# Patient Record
Sex: Male | Born: 1945 | Race: White | Hispanic: No | Marital: Married | Smoking: Never smoker
Health system: Southern US, Community
[De-identification: ages and names within clinical notes are randomized; demographics above are authoritative.]

## PROBLEM LIST (undated history)

## (undated) DIAGNOSIS — N183 Chronic kidney disease, stage 3 unspecified: Secondary | ICD-10-CM

## (undated) DIAGNOSIS — E039 Hypothyroidism, unspecified: Secondary | ICD-10-CM

## (undated) DIAGNOSIS — N200 Calculus of kidney: Secondary | ICD-10-CM

## (undated) DIAGNOSIS — E785 Hyperlipidemia, unspecified: Secondary | ICD-10-CM

## (undated) DIAGNOSIS — I10 Essential (primary) hypertension: Secondary | ICD-10-CM

## (undated) DIAGNOSIS — I509 Heart failure, unspecified: Secondary | ICD-10-CM

## (undated) DIAGNOSIS — I219 Acute myocardial infarction, unspecified: Secondary | ICD-10-CM

## (undated) DIAGNOSIS — I2510 Atherosclerotic heart disease of native coronary artery without angina pectoris: Secondary | ICD-10-CM

## (undated) HISTORY — PX: NEPHROLITHOTOMY: SUR881

## (undated) HISTORY — PX: RHINOPLASTY: SUR1284

## (undated) HISTORY — PX: SHOULDER SURGERY: SHX246

---

## 1997-11-10 ENCOUNTER — Inpatient Hospital Stay (HOSPITAL_COMMUNITY): Admission: AD | Admit: 1997-11-10 | Discharge: 1997-11-14 | Payer: Self-pay | Admitting: Cardiology

## 1998-02-18 ENCOUNTER — Inpatient Hospital Stay (HOSPITAL_COMMUNITY): Admission: EM | Admit: 1998-02-18 | Discharge: 1998-02-20 | Payer: Self-pay | Admitting: Emergency Medicine

## 1999-03-12 ENCOUNTER — Ambulatory Visit (HOSPITAL_COMMUNITY): Admission: RE | Admit: 1999-03-12 | Discharge: 1999-03-12 | Payer: Self-pay | Admitting: Cardiology

## 1999-04-05 ENCOUNTER — Encounter: Payer: Self-pay | Admitting: Cardiothoracic Surgery

## 1999-04-07 ENCOUNTER — Encounter: Payer: Self-pay | Admitting: Cardiothoracic Surgery

## 1999-04-07 ENCOUNTER — Inpatient Hospital Stay (HOSPITAL_COMMUNITY): Admission: RE | Admit: 1999-04-07 | Discharge: 1999-04-13 | Payer: Self-pay | Admitting: Cardiothoracic Surgery

## 1999-04-07 HISTORY — PX: CORONARY ARTERY BYPASS GRAFT: SHX141

## 1999-04-08 ENCOUNTER — Encounter: Payer: Self-pay | Admitting: Cardiothoracic Surgery

## 1999-04-09 ENCOUNTER — Encounter: Payer: Self-pay | Admitting: Cardiothoracic Surgery

## 1999-06-22 ENCOUNTER — Encounter (HOSPITAL_COMMUNITY): Admission: RE | Admit: 1999-06-22 | Discharge: 1999-09-20 | Payer: Self-pay | Admitting: Cardiology

## 1999-08-10 ENCOUNTER — Inpatient Hospital Stay (HOSPITAL_COMMUNITY): Admission: EM | Admit: 1999-08-10 | Discharge: 1999-08-11 | Payer: Self-pay | Admitting: Emergency Medicine

## 1999-08-10 ENCOUNTER — Encounter: Payer: Self-pay | Admitting: Emergency Medicine

## 2000-06-14 ENCOUNTER — Encounter: Payer: Self-pay | Admitting: Emergency Medicine

## 2000-06-14 ENCOUNTER — Inpatient Hospital Stay (HOSPITAL_COMMUNITY): Admission: EM | Admit: 2000-06-14 | Discharge: 2000-06-15 | Payer: Self-pay | Admitting: Emergency Medicine

## 2000-11-28 ENCOUNTER — Ambulatory Visit (HOSPITAL_BASED_OUTPATIENT_CLINIC_OR_DEPARTMENT_OTHER): Admission: RE | Admit: 2000-11-28 | Discharge: 2000-11-28 | Payer: Self-pay | Admitting: Pulmonary Disease

## 2001-03-31 ENCOUNTER — Ambulatory Visit (HOSPITAL_COMMUNITY): Admission: RE | Admit: 2001-03-31 | Discharge: 2001-03-31 | Payer: Self-pay | Admitting: Family Medicine

## 2001-03-31 ENCOUNTER — Encounter: Payer: Self-pay | Admitting: Family Medicine

## 2003-08-05 HISTORY — PX: ORIF FINGER FRACTURE: SHX2122

## 2003-08-31 ENCOUNTER — Inpatient Hospital Stay (HOSPITAL_COMMUNITY): Admission: EM | Admit: 2003-08-31 | Discharge: 2003-09-01 | Payer: Self-pay | Admitting: Emergency Medicine

## 2005-06-12 ENCOUNTER — Emergency Department (HOSPITAL_COMMUNITY): Admission: EM | Admit: 2005-06-12 | Discharge: 2005-06-13 | Payer: Self-pay | Admitting: Emergency Medicine

## 2005-06-30 ENCOUNTER — Ambulatory Visit: Payer: Self-pay | Admitting: Cardiology

## 2005-07-07 ENCOUNTER — Ambulatory Visit (HOSPITAL_COMMUNITY): Admission: RE | Admit: 2005-07-07 | Discharge: 2005-07-09 | Payer: Self-pay | Admitting: Urology

## 2005-07-07 ENCOUNTER — Encounter (INDEPENDENT_AMBULATORY_CARE_PROVIDER_SITE_OTHER): Payer: Self-pay | Admitting: Specialist

## 2007-09-27 ENCOUNTER — Ambulatory Visit (HOSPITAL_COMMUNITY): Admission: RE | Admit: 2007-09-27 | Discharge: 2007-09-27 | Payer: Self-pay | Admitting: Internal Medicine

## 2008-01-10 ENCOUNTER — Inpatient Hospital Stay (HOSPITAL_COMMUNITY): Admission: RE | Admit: 2008-01-10 | Discharge: 2008-01-12 | Payer: Self-pay | Admitting: Orthopaedic Surgery

## 2008-09-27 ENCOUNTER — Emergency Department (HOSPITAL_COMMUNITY): Admission: EM | Admit: 2008-09-27 | Discharge: 2008-09-27 | Payer: Self-pay | Admitting: Emergency Medicine

## 2009-11-30 ENCOUNTER — Observation Stay (HOSPITAL_COMMUNITY): Admission: RE | Admit: 2009-11-30 | Discharge: 2009-12-01 | Payer: Self-pay | Admitting: Urology

## 2010-08-22 LAB — CBC
HCT: 40 % (ref 39.0–52.0)
Hemoglobin: 13.2 g/dL (ref 13.0–17.0)
MCHC: 33.1 g/dL (ref 30.0–36.0)
MCV: 98 fL (ref 78.0–100.0)
RBC: 4.08 MIL/uL — ABNORMAL LOW (ref 4.22–5.81)
WBC: 7.7 10*3/uL (ref 4.0–10.5)

## 2010-08-22 LAB — BASIC METABOLIC PANEL
BUN: 16 mg/dL (ref 6–23)
CO2: 29 mEq/L (ref 19–32)
Chloride: 102 mEq/L (ref 96–112)
Creatinine, Ser: 1.5 mg/dL (ref 0.4–1.5)
Glucose, Bld: 90 mg/dL (ref 70–99)

## 2010-08-22 LAB — TYPE AND SCREEN
ABO/RH(D): A NEG
Antibody Screen: NEGATIVE

## 2010-08-22 LAB — URINE CULTURE
Colony Count: NO GROWTH
Culture: NO GROWTH

## 2010-08-22 LAB — HEMOGLOBIN AND HEMATOCRIT, BLOOD
HCT: 34.4 % — ABNORMAL LOW (ref 39.0–52.0)
Hemoglobin: 11.8 g/dL — ABNORMAL LOW (ref 13.0–17.0)

## 2010-08-22 LAB — SURGICAL PCR SCREEN: MRSA, PCR: NEGATIVE

## 2010-10-19 NOTE — Op Note (Signed)
NAME:  [name], [name]         ACCOUNT NO.:  [id number]   MEDICAL RECORD NO.:  [id number]          PATIENT TYPE:  INP   LOCATION:  5002                         FACILITY:  [facility]   PHYSICIAN:  Lubertha Basque. Dalldorf, M.D.DATE OF BIRTH:  04/12/1946   DATE OF PROCEDURE:  01/10/2008  DATE OF DISCHARGE:                               OPERATIVE REPORT   PREOPERATIVE DIAGNOSIS:  Left shoulder degenerative arthritis.   POSTOPERATIVE DIAGNOSIS:  Left shoulder degenerative arthritis.   PROCEDURE:  Left shoulder hemiarthroplasty.   ANESTHESIA:  General and block.   ATTENDING SURGEON:  Lubertha Basque. Jerl Santos, MD.   ASSISTANT:  [name], PA   INDICATIONS FOR PROCEDURE:  The patient is a 65 year old man with a very  long history of shoulder pain.  This has persisted despite oral anti-  inflammatories and injectables.  He has bone-on-bone degeneration and  pain which limits his ability to rest and use his arm.  He is offered a  hemiarthroplasty.  Informed operative consent was obtained after  discussion of possible complications including reaction to anesthesia,  infection, and neurovascular injury.   SUMMARY, FINDINGS, AND PROCEDURE:  Under general anesthesia and a block,  a left shoulder hemiarthroplasty was performed through a deltopectoral  approach.  He had advanced degenerative change, but excellent bone  quality.  We addressed his problem with an uncemented [employer]  system using a size 12 humeral component placed in 25 or 30 degrees of  retroversion, capped with a 56 x 18 humeral head.  Bryna Colander  assisted throughout and was invaluable to the completion of the case in  that he helped position and retract while I performed the procedure.  He  also closed simultaneously to help minimize the OR time.   DESCRIPTION OF PROCEDURE:  The patient was brought to the operating  suite where general anesthetic was applied without difficulty.  He was  also given a block in the  preanesthesia area.  He was positioned in a  slight beach chair position though fairly reclined.  He was prepped and  draped in normal sterile fashion.  After administration of IV Kefzol, a  standard deltopectoral approach was taken to the left shoulder.  An  incision was carried down to the deltopectoral interval which was  exploited to expose the shoulder.  I freed up the deltopectoral fascia.  We took the conjoined tendon in a medial direction.  The subscapularis  was released directly off bone with no tagged left in hopes of advancing  and improving his external rotation which was basically zero.  I tagged  this with #1 Ethibond suture.  The humeral head was then visualized.  The rotator cuff and biceps tendon were felt to be intact though the  biceps did have a partial tear.  I used an oscillating saw to make a cut  at an appropriate level with about 25 degrees of retroversion.  We then  reamed up to 12 followed by sequential broaching up to 12.  We did a  trial reduction and the 56 x 18 seemed to be most appropriate.  We then  placed the aforementioned components in 25  or 30 degrees of  retroversion.  We then capped with the 56 x 18 head assembly.  We did  make drill holes at the neck of the humerus in order to perform our  subscapularis repair.  The shoulder was reduced and was found to be  stable.  I then repaired the subscapularis to the leading edge of our  humeral head cut, thereby medializing this structure about 1 cm.  This  seemed to give [place] a nice tight repair which was stable.  I irrigated and  then we removed retractors and closed the deltopectoral interval loosely  with Vicryl.  Subcutaneous tissues reapproximated with 0 and 2-0 undyed  Vicryl and skin was closed with staples.  Adaptic was applied followed  by dry gauze and tape.  Estimated blood loss and intraoperative fluids  obtained from anesthesia records.   DISPOSITION:  The patient was extubated in the operating  room and taken  to recovery room in stable addition.  He was to be admitted to  orthopedic surgery service for appropriate postop care to include  perioperative antibiotics and immediate mobilization out of bed for DVT  prophylaxis.      Lubertha Basque Jerl Santos, M.D.  Electronically Signed     PGD/MEDQ  D:  01/10/2008  T:  01/11/2008  Job:  [id number]

## 2010-10-19 NOTE — Discharge Summary (Signed)
NAME:  [name], [name]         ACCOUNT NO.:  [id number]   MEDICAL RECORD NO.:  [id number]          PATIENT TYPE:  INP   LOCATION:  5002                         FACILITY:  [facility]   PHYSICIAN:  Lubertha Basque. Dalldorf, M.D.DATE OF BIRTH:  [birth year]   DATE OF ADMISSION:  01/10/2008  DATE OF DISCHARGE:  01/12/2008                               DISCHARGE SUMMARY   ADMITTING DIAGNOSES:  1. Left shoulder end-stage degenerative joint disease.  2. Hypertension.  3. Depression.   DISCHARGE DIAGNOSES:  1. Left shoulder end-stage degenerative joint disease.  2. Hypertension.  3. Depression.   OPERATION:  Left shoulder hemiarthroplasty.   BRIEF HISTORY:  [name] is a 65 year old white male patient, well-known  to our practice, who has had increasing left shoulder pain and inability  to move his arm comfortably with significant reduced range of motion.  Trouble sleeping at nighttime as well as increased.  He, on x-ray, has  end-stage bone-on-bone of his humeral head about maintenance of his  glenoid type or joint space.  He has had multiple intra-articular  injections, which had relieved his discomfort in short term, and we  discussed treatment options with him that being shoulder  hemiarthroplasty.   PERTINENT LABORATORY AND X-RAY FINDINGS:  Laboratory data; he had CBC,  iron, and chem-7, which are included in the electronic medical record,  not available at the time of this dictation.   X-ray revealed a normal chest without significant adverse features.   COURSE IN THE HOSPITAL:  [name] was admitted postoperatively,  placed on variety of oral analgesics for pain, IV Ancef q.8 h. x3 doses  and Dilaudid PCA pump was used.  He was in a sling.  He had incentive  spirometer and in and out catheter as needed.  He was kept on his home  medicines, which will be outlined at the end of this dictation, also on  a muscle relaxant.  The first day postoperative, his vital signs were  stable.  Blood pressure 101/70, heart rate 94, temperature 97, and O2  was 100.  He had good neurovascular status into his finger tips, and  left-sided dressing was benign.  No signs of infection or irritation of  the wound without sign of infection.  His dressing was changed on the  second day postop, and he was discharged home.   CONDITION ON DISCHARGE:  Improved.   FOLLOWUP:  He is on a low-sodium heart-healthy diet.  Continue on a  sling.  May change his dressing daily.  If any sign of infection, to  call the office at [phone], to be seen immediately or an appointment in  2 weeks just for normal followup.  He is kept on his home medicines,  which will be outlined.  Now, he was given a prescription for;  1. Percocet 1 or 2 q.4-6 h. p.r.n. pain.  2. Kept on Abilify 10 mg at bedtime.  3. Ambien 15 mg as needed for sleep.  4. Aspirin 81 mg once a day.  5. Lopressor 25 mg once a day.  6. Multivitamin once a day.  7. Lovaza 1 g capsule.  8. Fish  oil at bedtime.  9. Pepcid 40 mg as needed once a day.  10.Prinivil 5 mg once a day.  11.Synthroid 50 mcg once a day.  12.Zocor 20 mg once a day.  13.Robaxin 500 mg as needed for spasm q.6 h.      [name], P.A.      Lubertha Basque Jerl Santos, M.D.  Electronically Signed    MC/MEDQ  D:  01/12/2008  T:  01/13/2008  Job:  [id number]

## 2010-10-22 NOTE — Op Note (Signed)
[place]. [facility]  Patient:    [name]                 MRN: [id number] Proc. Date: 04/07/99 Adm. Date:  [id number] Attending:  Waldo Laine CC:         Gwenith Daily. Tyrone Sage, M.D.             Luis Abed, M.D. [facility]                           Operative Report  PREOPERATIVE DIAGNOSIS:  Coronary occlusive disease.  POSTOPERATIVE DIAGNOSIS:  Coronary occlusive disease.  OPERATION:  Coronary artery bypass grafting x 3 with left internal mammary artery to left anterior descending coronary artery, reverse saphenous vein graft to the diagonal coronary artery, reverse saphenous vein graft to the second obtuse marginal coronary artery.  SURGEON:  Gwenith Daily. Tyrone Sage, M.D.  ASSISTANTS: 1. Alleen Borne, M.D. 2. Luretha Rued. Ezzard Standing, P.A.  INDICATION:  The patient is a 65 year old male who previously had suffered an acute anterior myocardial infarction and underwent angioplasty and stenting of the LAD at the takeoff of a large diagonal branch.  The patient had initially done well but with recurrent symptoms had repeat cardiac catheterization.  This demonstrated  patent right coronary artery, a total occlusion of the LAD at the takeoff of the diagonal.  The diagonal itself had a 70% lesion.  In addition, the second obtuse marginal had a 70% lesion.  The patients LV function was depressed with anterior hypokinesis.  Because of the patients symptoms and significant LAD and diagonal disease, coronary artery bypass grafting was recommended.  The patient agreed and signed informed consent.  DESCRIPTION OF PROCEDURE:  With Swan-Ganz and arterial line monitoring in place, the patient underwent general endotracheal anesthesia without incident.  The skin, chest, and legs were prepped with Betadine and draped in the usual sterile manner.  A vein was harvested from the left lower leg and was of adequate quality and caliber.  Median  sternotomy was performed.  The left internal mammary artery was dissected down to the pedicle graft.  The distal artery was divided and had good free flow.  The pericardium was opened.  The patient did have anterior hypokinesis and obvious area of infarct on the apical region.  Consideration for off pump bypass was entertained; however, due to the patients  enlarged left ventricle, he was systemically heparinized.  It was decided to proceed with standard bypass.  He was systemically heparinized.  The ascending aorta and right atrium were cannulated.  In the aortic root, vent cardioplegia needle was introduced into the ascending aorta.  The patient was placed on cardiopulmonary bypass at 2.4 liters per minutes per sq/m.  Sites of anastomoses were selected and dissected out of the epicardium. The patients body temperature was cooled to 28 degrees.  Aortic cross clamp was applied and 500 cc of cold blood potassium cardioplegia was administered.  With  rapid diastolic arrest of the heart, myocardial septal temperature was monitored throughout the cross clamp.  Attention was turned first to the obtuse marginal coronary artery which was opened. The second obtuse marginal coronary artery which was opened and admitted a 1.5 m probe.  Using a running 7-0 Prolene, a distal anastomosis was performed with a segment of reverse saphenous vein graft.  Attention was then turned to the larger diagonal coronary artery which was opened.  This was high on the  lateral wall and not suitable for sequential bypass with mammary.  The vessel was opened and admitted a 1.5 mm probe.  Using a running 7-0 Prolene, distal anastomosis was performed.  Attention was turned to the left anterior descending coronary artery which was opened in the midportion of the vessel.  A 1.5 mm probe passed distally.  Using a running 8-0 Prolene, a left internal mammary artery was anastomosed into the left anterior  descending coronary artery with a running 8-0 Prolene with release of he Edwards bulldog on the mammary artery.  There was appropriate rise in myocardial septal temperature.  Aortic cross clamp was removed.  Total cross clamp was 48 minutes.  The patient required electrical defibrillation to return to a sinus rhythm. Partial occlusion clamp was placed on the ascending aorta.  Two punch aortotomies were performed.  Each of the two vein grafts were anastomosed to the ascending aorta.  Air was evacuated from the graft and partial occlusion clamp was removed. Sites of anastomosis were inspected and free of bleeding with the exception of he diagonal coronary artery where additional tacking sutures were placed.  The patient was then weaned from cardiopulmonary bypass without difficulty and remained hemodynamically.  He was decannulated in the usual fashion.  Protamine sulfate was administered with the operative field hemostatic.  Two atrial and two ventricular pacing wires were applied.  Graft markers were applied.  A left pleural tube and two mediastinal tubes were left in place.  The sternum was closed with a #6 stainless steel wire.  The fascia was closed with interrupted 0 Vicryl and a running 3-0 Vicryl in the  subcutaneous tissue, a 4-0 subcuticular stitch in the skin edges.  Dry dressings were applied.  Sponge and needle count was reported as correct at completion of the procedure.  The patient tolerated the procedure without obvious complication.  He was transferred to the surgical intensive care unit for further postoperative care.DD:  04/12/99 TD:  04/13/99 Job: [id number]

## 2010-10-22 NOTE — Consult Note (Signed)
NAME:  [name], [name]                   ACCOUNT NO.:  [id number]   MEDICAL RECORD NO.:  [id number]                   PATIENT TYPE:  INP   LOCATION:  5008                                 FACILITY:  [facility]   PHYSICIAN:  Artist Pais. Mina Marble, M.D.           DATE OF BIRTH:  04-27-1946   DATE OF CONSULTATION:  08/31/2003  DATE OF DISCHARGE:                                   CONSULTATION   PHYSICIAN REQUESTING CONSULTATION:  Dr. Ethelda Chick.   REASON FOR CONSULTATION:  Mr. [name] is a 65 year old right  hand dominant male who was at work earlier today and got his hand caught  between a cage and a forklift.  He presents today with an open wound on the  lower aspect of his right fifth digit with an angulated proximal phalangeal  fracture and open volar wound over the metacarpophalangeal joint.  He is 57.  He has an allergy to SULFA DRUGS.  He is currently on Accupril, an aspirin a  day and Prozac.  He has a history of coronary artery bypass grafting,  hypertension, elevated cholesterol levels.  Dr. Nicholos Johns is his medical doctor.  No recent hospitalizations or surgeries.   FAMILY HISTORY:  Noncontributory.   SOCIAL HISTORY:  Noncontributory.   PHYSICAL EXAMINATION:  GENERAL:  This is a well-nourished male, pleasant,  alert and oriented times three.  EXTREMITIES:  Examination of his hand on the right has an obvious transverse  volar wound over the metacarpophalangeal joint.  His profundus superficialis  function appears to be intact.  NEUROLOGIC:  He is intact to sharp, dull, but he has an angulated digit on  the hand.   X-rays show an angulated proximal phalangeal fracture of the fifth digit.   IMPRESSION:  This is a 65 year old male with an open proximal phalangeal  fracture, angulated, on his dominant right hand.   RECOMMENDATIONS:  At this point and time, we will go to the operating room  for irrigation and debridement, percutaneous pinning and wound closure.  He  will have admission for antibiotics to treat his open fracture.                                               Artist Pais Mina Marble, M.D.    MAW/MEDQ  D:  08/31/2003  T:  09/01/2003  Job:  [id number]

## 2010-10-22 NOTE — Discharge Summary (Signed)
[name]. [facility]  Patient:    [name], [name]                MRN: [id number] Adm. Date:  [id number] Disc. Date: 06/15/00 Attending:  Talitha Givens Dictator:   Tereso Newcomer, P.A. CC:         Elana Alm. Eliezer Lofts., M.D.   Discharge Summary  DATE OF BIRTH:  [birth year]  REASON FOR ADMISSION:  Chest pain.  DISCHARGE DIAGNOSES: 1. Coronary artery disease. 2. Status post coronary artery bypass grafting in November of 2000, LIMA    to LAD, SVG to D2, SVG to OM-2. 3. Catheterization in March of 2001, LAD totaled after diagonal 2, 70-80%    before LIMA, D1 50%, D2 50% ramus intermediate, mild, circumflex okay,    OM-1 moderate to severe, diffuse, OM-2 totaled, RCA dominant, 20-30%,    grafts x 3, patent, EF 35%. 4. Hyperlipidemia. 5. Status post rhinoplasty. 6. History of nephrolithiasis surgery.  ALLERGIES:  SULFA.  PROCEDURES PERFORMED THIS ADMISSION:  Adenosine Cardiolite on June 15, 2000, revealing EF 34%, anterior akinesis, anteroseptal and apical scar, no ischemia.  ADMISSION HISTORY:  This 65 year old male awoke on the morning of admission with right-sided chest pain, 4/10 at its worst, described as sore or tight. He had some associated shortness of breath, no nausea or vomiting or diaphoresis.  He took nitroglycerin x 3 at home that helped and then he came to the emergency room.  He had noted some fatigue in the past 2-3 weeks and dyspnea on exertion, but no palpitations or edema.  PHYSICAL EXAMINATION:  Initial physical exam revealed a well-nourished, well-developed male in no acute distress.  Vital Signs:  Blood pressure was 124/66, pulse 72, respirations 20, O2 100% on room air, temperature 97.3. Neck without JVD or bruits.  Chest clear to auscultation bilaterally. Cardiac:  Regular rate and rhythm.  No significant murmurs, rubs, or gallops. Abdomen soft, nontender.  Extremities without edema.  LABORATORY:  Chest  x-ray:  Cardiac enlargement, post CABG, no acute disease. EKG sinus rhythm, no acute changes.  Initial labs:  Hemoglobin 13, hematocrit 38.1, white count 6500, platelet count 217,000.  CK 193, CK-MB 2.8, troponin I 0.01.  INR 1.0, PTT 28, sodium 138, potassium 4.6, chloride 104, CO2 30, BUN 20, creatinine 1.3, glucose 101.  HOSPITAL COURSE:  Patient was admitted for chest pain and ruled out for myocardial infarction by enzymes.  He went for the adenosine Cardiolite as noted above on June 16, 1999.  He tolerated the procedure well and had no immediate complications.  Given the results of the Cardiolite and negative cardiac enzymes, it was felt he was stable enough for discharge to home on the afternoon of June 15, 2000.  He is to continue his current therapy.  DISCHARGE MEDICATIONS: 1. Prozac as taken previously. 2. Lipitor 20 mg q.h.s. 3. Coated aspirin 325 mg q.d. 4. Toprol XL 50 mg q.d. 5. Vitamin E 400 international units q.d. 6. Multivitamin q.d. 7. Nitroglycerin 0.4 mg _________ chest pain.  ACTIVITY:  As tolerated.  DIET:  Low fat, low sodium diet.  WOUND CARE:  Not applicable.  FOLLOW-UP:  Follow-up is with Dr. Myrtis Ser on Friday, July 21, 2000, at 10:30 a.m.  He should follow up with his primary care physician as needed. DD:  06/15/00 TD:  06/15/00 Job: [id number]

## 2010-10-22 NOTE — Cardiovascular Report (Signed)
[place]. [facility]  Patient:    [name], [name]                MRN: [id number] Proc. Date: 08/11/99 Adm. Date:  [id number] Disc. Date: [id number] Attending:  Ronaldo Miyamoto CC:         Veneda Melter, M.D. [facility]             Luis Abed, M.D. [facility]             Robert A. Eliezer Lofts., M.D.                        Cardiac Catheterization  PROCEDURES PERFORMED: 1. Left heart catheterization. 2. Left ventriculogram. 3. Angiography of left internal mammary bypass graft. 4. Angiography saphenous vein graft. 5. Perclose suture of right femoral artery.  DIAGNOSES: 1. Native two-vessel coronary artery disease. 2. Patent left internal mammary artery and saphenous vein bypass grafts. 3. Moderate left ventricular dysfunction.  INDICATIONS:  Mr. [name] is a 65 year old white male with a history of coronary artery disease who suffered and anterior wall myocardial infarction in the past.  He has undergone percutaneous interventions and subsequent undergone coronary artery bypass graft surgery in November of 2000 with placement of a LIMA to the LAD, saphenous vein graft to the second diagonal branch and saphenous vein graft to the second marginal branch in the left circumflex artery.  He presents now with recurrence of chest discomfort prompting his admission to the hospital. He ruled out for acute myocardial infarction and presents now for left heart catheterization.  TECHNIQUE:  After informed consent was obtained, the patient was brought to the  cardiac catheterization lab where both groins were sterilely prepped and draped. Lidocaine 1% was used to infiltrate the right groin, and a 6 French sheath placed into the right femoral artery using the modified Seldinger technique.  The 6 [place] and JR4 catheters were then used to engage the left and right coronary arteries, and selective angiography was performed in various projections  using manual injections of contrast.  A JR4 catheter was then used to engage the saphenous vein graft to the marginal branch.  A LCD catheter was used to engage the saphenous vein graft to the second diagonal branch.  Finally an internal mammary catheter was used to engage the LIMA graft to the LAD.  A 6 French pigtail was subsequently advanced into the left ventricle and a left ventriculogram performed using power injections of contrast in the RAO projection.  At the termination of the case a Perclose suture closure device was deployed to the right femoral artery after an angiogram was performed confirming position of the sheath above the femoral bifurcation.  The first device failed to deploy sutures appropriately and a second device was deployed with adequate hemostasis.  The patient tolerated the  procedure well and was transferred to the floor in stable condition.  FINDINGS:  Left main trunk:  The left main trunk has mild irregularities.  Left anterior descending:  This again is a large caliber vessel that provides two diagonal branches in its proximal segment.  The LAD has mild disease in the proximal segment of 30%.  It is occluded after the second diagonal branch at the area of previous stenting.  The distal LAD has a long area of narrowing of 70-80% after the stent preceding the insertion site with a LIMA graft.  The remainder f the LAD is a small caliber vessel that  is seen to fill the LIMA graft and has mild irregularities.  The first diagonal branch is a medium caliber vessel that has a mild narrowing f 40-50% in its proximal segment.  The second diagonal branch is a medium caliber  vessel that has mild diffuse disease in the proximal mid section of approximately 50%. The distal section of the vessel fills via saphenous vein graft.  Ramus intermedius:  This is a medium caliber vessel that has mild irregularities.  Left circumflex artery:  This is a small  caliber vessel that provides two marginal branches in the mid section.  The first marginal branch is a trivial vessel with moderate to severe diffuse disease.  The second marginal branch is occluded in ts proximal segment.  It is seen to fill via a saphenous vein graft and has mild disease in its proximal segment with the mid and distal segment filling adequately via the saphenous vein graft.  Right coronary artery:  Right coronary artery is dominant.  This is a large caliber vessel that provides a posterior descending artery as well as a posterior ventricular branch in its terminal left segment.  There is mild diffuse disease in the right coronary artery with narrowing of 20-30%.  LIMA to the LAD:  Patent.  Saphenous vein graft to the second diagonal branch:  Patent.  saphenous vein graft to the second marginal branch:  Patent.  LEFT VENTRICULOGRAM:  Mildly dilated end-systolic and end-diastolic dimensions.  Overall left ventricular function is moderately impaired.  Ejection fraction approximately 35%.  There is akinesis of the mid anterior wall with severe hypokinesis of the distal anterior and apical walls.  No mitral regurgitation is noted.  LV pressure is 112/10, aortic is 112/70, LVEDP equals 29.  ASSESSMENT AND PLAN:  Mr. Record is a 65 year old gentleman with stable coronary artery disease.  At this point there are no critical lesions in his major epicardial vessels that require attention and thus further medical management will be pursued. DD:  08/11/99 TD:  08/12/99 Job: [id number]

## 2010-10-22 NOTE — Op Note (Signed)
NAME:  [name], [name]                   ACCOUNT NO.:  [id number]   MEDICAL RECORD NO.:  [id number]                   PATIENT TYPE:  EMS   LOCATION:  MINO                                 FACILITY:  [facility]   PHYSICIAN:  Artist Pais. Mina Marble, M.D.           DATE OF BIRTH:  [birth year]   DATE OF PROCEDURE:  08/31/2003  DATE OF DISCHARGE:                                 OPERATIVE REPORT   PREOPERATIVE DIAGNOSES:  Grade II open fracture, proximal phalanx, right  hand, fifth digit.   POSTOPERATIVE DIAGNOSES:  Grade II open fracture, proximal phalanx, right  hand, fifth digit.   PROCEDURE:  Irrigation and debridement, open reduction and internal fixation  open fracture,proximal phalanx, right hand, fifth digit.   SURGEON:  Artist Pais. Mina Marble, M.D.   ASSISTANT:  None.   ANESTHESIA:  General.   TOURNIQUET TIME:  30 minutes.   COMPLICATIONS:  None.   DRAINS:  None.   DESCRIPTION OF PROCEDURE:  The patient was taken to the operating room.  After the induction of adequate general anesthesia, the right upper  extremity was prepped and draped in the usual sterile fashion.  An Esmarch  was used to exsanguinate the limb.  Tourniquet was inflated to 250 mmHg.  At  this point in time, an open wound over the volar aspect of the  metacarpophalangeal joint area of the right hand was irrigated and debrided  with a 2 liters of normal saline.  Clot and nonviable material was irrigated  out.  This wound was then loosely closed with 5-0 nylon.  The patient had an  open fracture of the proximal phalanx.  The patient was placed in a Jahss  maneuver to reduce the fracture, and two 045 K wires were driven from  proximal to distal in cross fashion across the fracture site from dorsal to  volar to reduce the fracture and stabilize it.  The K wires were cut outside  the skin and bent upon themselves.  The patient was then placed in a sterile  dressing with Xeroform, 4 x 4 fluffs, and a volar splint  with the wrist  extended, MPs and PIPs flexed, position to safety.  The patient tolerated  the procedure well and went to recovery in stable fashion.                                               Artist Pais Mina Marble, M.D.    MAW/MEDQ  D:  08/31/2003  T:  08/31/2003  Job:  [id number]

## 2010-10-22 NOTE — Op Note (Signed)
NAME:  [name], [name]         ACCOUNT NO.:  [id number]   MEDICAL RECORD NO.:  [id number]          PATIENT TYPE:  OIB   LOCATION:  1415                         FACILITY:  [facility]   PHYSICIAN:  Excell Seltzer. Annabell Howells, M.D.    DATE OF BIRTH:  [birth year]   DATE OF PROCEDURE:  07/07/2005  DATE OF DISCHARGE:                                 OPERATIVE REPORT   PROCEDURE:  Left percutaneous nephrolithotomy for greater than 2.5 cm stone.   PREOPERATIVE DIAGNOSIS:  Left renal stone.   POSTOPERATIVE DIAGNOSIS:  Left renal stone.   SURGEON:  Bjorn Pippin, M.D.   RADIOLOGIST:  D. Oley Balm, M.D.   ANESTHESIA:  General.   SPECIMEN:  Stone fragments.   BLOOD LOSS:  Approximately 200 mL.   DRAINS:  24-French Ainsworth nephrostomy tube and Foley catheter.   COMPLICATIONS:  None.   INDICATIONS:  [name] is a 65 year old white male with a 28-mm left  renal pelvic stone who is to undergo a percutaneous nephrolithotomy.   FINDINGS AND PROCEDURE:  The patient was given Cipro this morning, he was  taken to the radiology suite where upper pole access was obtained onto his  renal pelvic stone.   He was then taken to the operating room where general anesthetic was induced  on a holding room stretcher. He was then rolled prone on chest rolls, all  pressure points were padded appropriately. A Foley catheter had been placed  using sterile technique prior to positioning and PAS hose were placed.   Once in position, the patient was prepped with Betadine solution and draped  in the usual sterile fashion.   Dr. Deanne Coffer then dilated the nephrostomy tract with the TractMaster balloon  after I incised the skin at the nephrostomy site with a knife. Prior to  dilating the tract, a safety wire had been placed. The nephrostomy sheath  was passed over the balloon catheter and the balloon catheter was removed  leaving the wire through the sheath.   The rigid nephroscope was then passed, a few clots  were removed and then the  stone was visualized.   The stone was engaged with the combination lithoclast ultrasonic lithotrite  and been broken into manageable fragments which were then removed with a  three-prong grasper.   After the entire stone had been fragmented and removed, visual inspection  revealed no residual fragments and fluoroscopic inspection revealed no  obvious residual fragments.   At this point, a 24-French Ainsworth catheter was passed over the guidewire  through the sheath, the balloon was filled with 3 mL of fluid. The wire was  removed, contrast was instilled indicating good position of the nephrostomy  tube. The working sheath was then removed from off the catheter and a safety  catheter was placed over the safety wire to the distal ureter.   The nephrostomy tube and safety catheter then secured to the skin with 2-0  silk suture. The drapes removed, a dressing of 4x4s, ABD and Hypafix was  applied, the nephrostomy tube was placed to straight drainage and secured to  the patient's leg.   The patient was then rolled back  supine, his anesthetic was reversed and he  was moved to the recovery room in stable condition and there were no  complications.      Excell Seltzer. Annabell Howells, M.D.  Electronically Signed     JJW/MEDQ  D:  07/07/2005  T:  07/07/2005  Job:  [id number]   cc:   Molly Maduro A. Nicholos Johns, M.D.  Fax: [phone]   Willa Rough, M.D.  [address]  [address]  [place]  [place] [address]

## 2010-10-22 NOTE — Discharge Summary (Signed)
[place]  Patient:    [name], [name]                MRN: [id number] Adm. Date:  [id number] Disc. Date: [id number] Attending:  Ronaldo Miyamoto Dictator:   Vania Rea. Rinehuls, PA CC:         Luis Abed, M.D. [facility]             Robert A. Eliezer Lofts., M.D.                           Discharge Summary  HISTORY ON ADMISSION:  This is a 65 year old male who presented to the emergency room for evaluation of chest pain.  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  His ejection fraction was 40% by Cardiolite performed in July 2000.  He has a history of an I from 1999 with PTCA stenting of the LAD.  The patient stated that his pain started the day before at work while climbing stairs.  His pain lasted approximately three hours.  He did not take any nitroglycerin.  The pain eased off on its own.  He went to work again on the day of admission and developed pain again while climbing stairs.  He states this is the first pain he has had recently.  The patient states the pain is in the left side of his chest without radiation.  Described as a constant burning associated with mild shortness of breath and diaphoresis.  There has been no nausea.  This pain is similar to what he experienced prior to his coronary artery bypass graft surgery.  He rated it as  seven on a 1-10 scale.  He stated that the episode on the day of admission lasted approximately 90 minutes and eased off on its own.  He went to the medical department at work and was sent to [facility] for evaluation.  He was pain free when seen in emergency room.  The patient apparently had a stress test one to two weeks ago in the office.  Apparently that was performed August 02, 1999.  It revealed an ejection  fraction of 34% and no signs of ischemia.  PAST MEDICAL HISTORY:  The patient has a history of elevated cholesterol levels. He has a history of renal calculi with previous surgery for this problem. History of a rhinoplasty, coronary artery bypass graft surgery as noted above.  He has o history of diabetes mellitus and no history of hypertension.  ALLERGIES:  SULFA.  MEDICATIONS:  Toprol, Lipitor, aspirin, Prozac.  Patient did not know the dosages. I was later to find out he was also on Accupril prior to admission as well.  SOCIAL HISTORY:  Patient is married.  He has two children.  He lives in [place].  He does not use alcohol or tobacco.  He works at [employer].  FAMILY HISTORY:  Positive for coronary artery disease.  HOSPITAL COURSE:  As noted, this patient was admitted to [facility] for further evaluation of chest pain.  Cardiac enzymes were found to be negative. e was scheduled for cardiac catheterization which was performed on August 11, 1999 y Dr. Chales Abrahams.  The patients ejection fraction was noted to be 35%.  He had native wo vessel coronary disease  with a patent LIMA and saphenous vein graft with moderate LV dysfunction.  The left main revealed mild disease.  The LAD had two  diagonal branches.  There was approximal 30% lesion, distal 100% lesion after the second diagonal which filled via the LIMA.  The first and second diagonals had proximal 50% lesions.  The ramus intermedius had mild disease, approximately 30%. The left circumflex had two OMs.  There was trivial OM disease.  OM2 was occluded and filled via the saphenous vein graft.  The RCA was dominant.  There was a PDA as well as a PV branch with mild disease of 20-30%.  Again, all the grafts were widely patent.  Arrangements were made to discharge the patient later that evening.  Continued medical management was recommended.  There were no critical lesions.  LABORATORIES:  A chest x-ray showed  stable cardiac enlargement, but no active disease.  Comprehensive metabolic panel was within normal limits.  Cardiac enzymes were negative.  A CBC revealed a mild anemia with hemoglobin 12.7, hematocrit 37.2, BC 6.9, platelets 201,000.  DISCHARGE MEDICATIONS:  The patient was told to stay on the same medications he was on prior to admission.  He was told to avoid any strenuous activity and no driving for two days.  He was told not to lift more than 10 pounds for one week, therefore he was not to return to work for at least one week.  He was to be on a low salt, low fat diet.  He was told to call the office if he had any increased pain, swelling, or bleeding from his groin.  He was told to call the office the following day for a follow-up appointment with Dr. Myrtis Ser.  He was to see Dr. Nicholos Johns as needed or as scheduled.  PROBLEM LIST: 1. Admission August 10, 1999 for evaluation of chest pain.  Cardiac enzymes negative. 2. Cardiac catheterization August 11, 1999 revealing EF 35% with patent grafts and no    critical lesions. 3. LV dysfunction EF 35%. 4. History of coronary artery bypass graft surgery November 2000.  Please see    grafts as described above. 5. History of MI in 1999 with PTCA stenting of the LAD. 6. Positive family history of coronary artery disease. 7. Cardiolite performed August 02, 1999.  Negative for ischemia, EF 34%. 8. Perclose system used following catheterization, therefore patient not to return    to work for one week. DD:  08/11/99 TD:  08/11/99 Job: [id number]

## 2011-03-04 LAB — BASIC METABOLIC PANEL
Calcium: 9.6
Creatinine, Ser: 1.52 — ABNORMAL HIGH
GFR calc Af Amer: 57 — ABNORMAL LOW
GFR calc non Af Amer: 47 — ABNORMAL LOW
Sodium: 135

## 2011-03-04 LAB — CBC
Hemoglobin: 14
Platelets: 231
RBC: 4.31
RDW: 14
WBC: 7.1

## 2011-07-10 ENCOUNTER — Other Ambulatory Visit: Payer: Self-pay

## 2011-07-10 ENCOUNTER — Encounter (HOSPITAL_COMMUNITY): Payer: Self-pay

## 2011-07-10 ENCOUNTER — Emergency Department (HOSPITAL_COMMUNITY): Payer: BC Managed Care – PPO

## 2011-07-10 ENCOUNTER — Inpatient Hospital Stay (HOSPITAL_COMMUNITY)
Admission: EM | Admit: 2011-07-10 | Discharge: 2011-07-12 | DRG: 543 | Disposition: A | Discharge status: Home / Self Care | Payer: BC Managed Care – PPO | Attending: Internal Medicine | Admitting: Internal Medicine

## 2011-07-10 DIAGNOSIS — R55 Syncope and collapse: Principal | ICD-10-CM | POA: Diagnosis present

## 2011-07-10 DIAGNOSIS — Z87442 Personal history of urinary calculi: Secondary | ICD-10-CM

## 2011-07-10 DIAGNOSIS — I429 Cardiomyopathy, unspecified: Secondary | ICD-10-CM | POA: Diagnosis present

## 2011-07-10 DIAGNOSIS — I2510 Atherosclerotic heart disease of native coronary artery without angina pectoris: Secondary | ICD-10-CM | POA: Diagnosis present

## 2011-07-10 DIAGNOSIS — N183 Chronic kidney disease, stage 3 unspecified: Secondary | ICD-10-CM | POA: Diagnosis present

## 2011-07-10 DIAGNOSIS — I509 Heart failure, unspecified: Secondary | ICD-10-CM

## 2011-07-10 DIAGNOSIS — I5022 Chronic systolic (congestive) heart failure: Secondary | ICD-10-CM | POA: Diagnosis present

## 2011-07-10 DIAGNOSIS — I129 Hypertensive chronic kidney disease with stage 1 through stage 4 chronic kidney disease, or unspecified chronic kidney disease: Secondary | ICD-10-CM | POA: Diagnosis present

## 2011-07-10 DIAGNOSIS — Z882 Allergy status to sulfonamides status: Secondary | ICD-10-CM

## 2011-07-10 DIAGNOSIS — N200 Calculus of kidney: Secondary | ICD-10-CM | POA: Diagnosis present

## 2011-07-10 DIAGNOSIS — Z79899 Other long term (current) drug therapy: Secondary | ICD-10-CM

## 2011-07-10 DIAGNOSIS — E039 Hypothyroidism, unspecified: Secondary | ICD-10-CM | POA: Diagnosis present

## 2011-07-10 DIAGNOSIS — I252 Old myocardial infarction: Secondary | ICD-10-CM

## 2011-07-10 DIAGNOSIS — Z951 Presence of aortocoronary bypass graft: Secondary | ICD-10-CM

## 2011-07-10 DIAGNOSIS — I219 Acute myocardial infarction, unspecified: Secondary | ICD-10-CM

## 2011-07-10 DIAGNOSIS — I2589 Other forms of chronic ischemic heart disease: Secondary | ICD-10-CM | POA: Diagnosis present

## 2011-07-10 HISTORY — DX: Acute myocardial infarction, unspecified: I21.9

## 2011-07-10 HISTORY — DX: Chronic kidney disease, stage 3 unspecified: N18.30

## 2011-07-10 HISTORY — DX: Essential (primary) hypertension: I10

## 2011-07-10 HISTORY — DX: Calculus of kidney: N20.0

## 2011-07-10 HISTORY — DX: Heart failure, unspecified: I50.9

## 2011-07-10 HISTORY — DX: Atherosclerotic heart disease of native coronary artery without angina pectoris: I25.10

## 2011-07-10 HISTORY — DX: Chronic kidney disease, stage 3 (moderate): N18.3

## 2011-07-10 HISTORY — DX: Hypothyroidism, unspecified: E03.9

## 2011-07-10 HISTORY — DX: Hyperlipidemia, unspecified: E78.5

## 2011-07-10 LAB — BASIC METABOLIC PANEL
Chloride: 101 mEq/L (ref 96–112)
Creatinine, Ser: 1.55 mg/dL — ABNORMAL HIGH (ref 0.50–1.35)
GFR calc Af Amer: 53 mL/min — ABNORMAL LOW (ref >90)
GFR calc non Af Amer: 46 mL/min — ABNORMAL LOW (ref >90)
Potassium: 4.5 mEq/L (ref 3.5–5.1)

## 2011-07-10 LAB — DIFFERENTIAL
Basophils Absolute: 0 10*3/uL (ref 0.0–0.1)
Basophils Relative: 0 % (ref 0–1)
Eosinophils Absolute: 0.1 10*3/uL (ref 0.0–0.7)
Monocytes Absolute: 0.8 10*3/uL (ref 0.1–1.0)
Neutro Abs: 6.5 10*3/uL (ref 1.7–7.7)
Neutrophils Relative %: 76 % (ref 43–77)

## 2011-07-10 LAB — CBC
MCH: 33.6 pg (ref 26.0–34.0)
MCHC: 34.1 g/dL (ref 30.0–36.0)
RDW: 13.3 % (ref 11.5–15.5)

## 2011-07-10 LAB — POCT I-STAT TROPONIN I: Troponin i, poc: 0.01 ng/mL (ref 0.00–0.08)

## 2011-07-10 MED ORDER — CARVEDILOL 3.125 MG PO TABS
3.1250 mg | ORAL_TABLET | Freq: Every day | ORAL | Status: DC
  Administered 2011-07-10 – 2011-07-12 (×3): 3.125 mg via ORAL
  Filled 2011-07-10 (×3): qty 1

## 2011-07-10 MED ORDER — OMEGA-3-ACID ETHYL ESTERS 1 G PO CAPS
1.0000 g | ORAL_CAPSULE | Freq: Two times a day (BID) | ORAL | Status: DC
  Administered 2011-07-10 – 2011-07-12 (×4): 1 g via ORAL
  Filled 2011-07-10 (×5): qty 1

## 2011-07-10 MED ORDER — DOXYCYCLINE HYCLATE 100 MG PO TABS
100.0000 mg | ORAL_TABLET | Freq: Two times a day (BID) | ORAL | Status: DC
  Administered 2011-07-10 – 2011-07-12 (×4): 100 mg via ORAL
  Filled 2011-07-10 (×5): qty 1

## 2011-07-10 MED ORDER — ALBUTEROL SULFATE (5 MG/ML) 0.5% IN NEBU: 2.5000 mg | INHALATION_SOLUTION | RESPIRATORY_TRACT | Status: DC | PRN

## 2011-07-10 MED ORDER — NITROGLYCERIN 0.4 MG SL SUBL: 0.4000 mg | SUBLINGUAL_TABLET | SUBLINGUAL | Status: DC | PRN

## 2011-07-10 MED ORDER — FLUOXETINE HCL 20 MG PO CAPS
20.0000 mg | ORAL_CAPSULE | Freq: Every day | ORAL | Status: DC
  Administered 2011-07-10 – 2011-07-12 (×3): 20 mg via ORAL
  Filled 2011-07-10 (×3): qty 1

## 2011-07-10 MED ORDER — ONDANSETRON HCL 4 MG/2ML IJ SOLN: 4.0000 mg | Freq: Four times a day (QID) | INTRAMUSCULAR | Status: DC | PRN

## 2011-07-10 MED ORDER — HYDROCODONE-ACETAMINOPHEN 5-325 MG PO TABS
1.0000 | ORAL_TABLET | ORAL | Status: DC | PRN
  Administered 2011-07-11: 1 via ORAL
  Administered 2011-07-11: 2 via ORAL
  Administered 2011-07-11: 1 via ORAL
  Administered 2011-07-12 (×2): 2 via ORAL
  Filled 2011-07-10: qty 2
  Filled 2011-07-10: qty 1
  Filled 2011-07-10 (×3): qty 2

## 2011-07-10 MED ORDER — GUAIFENESIN-DM 100-10 MG/5ML PO SYRP: 5.0000 mL | ORAL_SOLUTION | ORAL | Status: DC | PRN

## 2011-07-10 MED ORDER — ASPIRIN EC 81 MG PO TBEC
81.0000 mg | DELAYED_RELEASE_TABLET | Freq: Every day | ORAL | Status: DC
  Administered 2011-07-10 – 2011-07-12 (×3): 81 mg via ORAL
  Filled 2011-07-10 (×3): qty 1

## 2011-07-10 MED ORDER — SODIUM CHLORIDE 0.9 % IJ SOLN
3.0000 mL | Freq: Two times a day (BID) | INTRAMUSCULAR | Status: DC
  Administered 2011-07-10 – 2011-07-12 (×4): 3 mL via INTRAVENOUS

## 2011-07-10 MED ORDER — FAMOTIDINE 10 MG PO TABS
10.0000 mg | ORAL_TABLET | Freq: Every day | ORAL | Status: DC
  Administered 2011-07-10 – 2011-07-12 (×3): 10 mg via ORAL
  Filled 2011-07-10 (×3): qty 1

## 2011-07-10 MED ORDER — LEVOTHYROXINE SODIUM 50 MCG PO TABS
50.0000 ug | ORAL_TABLET | Freq: Every day | ORAL | Status: DC
  Administered 2011-07-10 – 2011-07-12 (×2): 50 ug via ORAL
  Filled 2011-07-10 (×3): qty 1

## 2011-07-10 MED ORDER — TAMSULOSIN HCL 0.4 MG PO CAPS
0.4000 mg | ORAL_CAPSULE | Freq: Every day | ORAL | Status: DC
  Administered 2011-07-10 – 2011-07-12 (×3): 0.4 mg via ORAL
  Filled 2011-07-10 (×3): qty 1

## 2011-07-10 MED ORDER — ONDANSETRON HCL 4 MG PO TABS: 4.0000 mg | ORAL_TABLET | Freq: Four times a day (QID) | ORAL | Status: DC | PRN

## 2011-07-10 MED ORDER — THERA M PLUS PO TABS
1.0000 | ORAL_TABLET | Freq: Every day | ORAL | Status: DC
  Administered 2011-07-10 – 2011-07-12 (×3): 1 via ORAL
  Filled 2011-07-10 (×3): qty 1

## 2011-07-10 MED ORDER — HEPARIN SODIUM (PORCINE) 5000 UNIT/ML IJ SOLN
5000.0000 [IU] | Freq: Three times a day (TID) | INTRAMUSCULAR | Status: DC
  Administered 2011-07-10 – 2011-07-12 (×5): 5000 [IU] via SUBCUTANEOUS
  Filled 2011-07-10 (×8): qty 1

## 2011-07-10 MED ORDER — ROSUVASTATIN CALCIUM 20 MG PO TABS
20.0000 mg | ORAL_TABLET | Freq: Every day | ORAL | Status: DC
  Administered 2011-07-10 – 2011-07-12 (×3): 20 mg via ORAL
  Filled 2011-07-10 (×3): qty 1

## 2011-07-10 MED ORDER — FUROSEMIDE 20 MG PO TABS
20.0000 mg | ORAL_TABLET | Freq: Once | ORAL | Status: AC
  Administered 2011-07-10: 20 mg via ORAL
  Filled 2011-07-10: qty 1

## 2011-07-10 MED ORDER — ZOLPIDEM TARTRATE 5 MG PO TABS
5.0000 mg | ORAL_TABLET | Freq: Every day | ORAL | Status: DC
  Administered 2011-07-10 – 2011-07-12 (×2): 5 mg via ORAL
  Filled 2011-07-10 (×3): qty 1

## 2011-07-10 MED ORDER — DOXYCYCLINE HYCLATE 100 MG PO CAPS: 100.0000 mg | ORAL_CAPSULE | Freq: Two times a day (BID) | ORAL | Status: DC

## 2011-07-10 NOTE — ED Provider Notes (Signed)
History     CSN: [id number]  Arrival date & time 07/10/11  1157   First MD Initiated Contact with Patient 07/10/11 1306      Chief Complaint  Patient presents with  . Near Syncope    (Consider location/radiation/quality/duration/timing/severity/associated sxs/prior treatment) HPI Comments: Patient reports that he had a syncopal episode earlier today.  He reports that he bent over and then became lightheaded and passed out.  LOC for 4-5 minutes according to wife.  He did hit his head when he fell.  He reports that he had a similar episode of syncope 5 days ago.  He reports that both times he was feeling lightheaded prior to the episode.  PMH significant for CABG in 2000 and recently diagnosed CHF currently on Lasix.  His PCP is Dr. Carlyle Basques with the [facility] in [place].  Patient is a 66 y.o. male presenting with syncope. The history is provided by the patient and the spouse.  Loss of Consciousness The problem has been resolved. Pertinent negatives include no abdominal pain, chest pain, chills, coughing, diaphoresis, fever, headaches, nausea, neck pain, numbness, rash, visual change or vomiting. The symptoms are aggravated by bending.    Past Medical History  Diagnosis Date  . Myocardial infarction   . Kidney stones   . Coronary artery disease     Past Surgical History  Procedure Date  . Shoulder surgery   . Coronary artery bypass graft     History reviewed. No pertinent family history.  History  Substance Use Topics  . Smoking status: Never Smoker   . Smokeless tobacco: Not on file  . Alcohol Use: No      Review of Systems  Constitutional: Negative for fever, chills and diaphoresis.  HENT: Negative for neck pain.   Respiratory: Negative for cough and shortness of breath.   Cardiovascular: Positive for syncope. Negative for chest pain.  Gastrointestinal: Negative for nausea, vomiting and abdominal pain.  Skin: Negative for rash.  Neurological: Positive for syncope and  light-headedness. Negative for seizures, numbness and headaches.  Psychiatric/Behavioral: Negative for confusion.    Allergies  Sulfa antibiotics  Home Medications  No current outpatient prescriptions on file.  BP 145/90  Pulse 93  Temp(Src) 98.6 F (37 C) (Oral)  Resp 14  SpO2 97%  Physical Exam  Nursing note and vitals reviewed. Constitutional: He is oriented to person, place, and time. He appears well-developed and well-nourished. No distress.  HENT:  Head:    Eyes: EOM are normal. Pupils are equal, round, and reactive to light.  Neck: Normal range of motion. Neck supple.  Cardiovascular: Normal rate, regular rhythm and normal heart sounds.   Pulmonary/Chest: Effort normal and breath sounds normal. He has no wheezes.  Abdominal: Soft. Bowel sounds are normal.  Neurological: He is alert and oriented to person, place, and time. He has normal strength. No cranial nerve deficit or sensory deficit. Coordination and gait normal.  Skin: Skin is warm and dry. No rash noted. He is not diaphoretic.  Psychiatric: He has a normal mood and affect.    ED Course  Procedures (including critical care time)  Labs Reviewed  CBC - Abnormal; Notable for the following:    RBC 4.17 (*)    All other components within normal limits  BASIC METABOLIC PANEL - Abnormal; Notable for the following:    Glucose, Bld 104 (*)    Creatinine, Ser 1.55 (*)    GFR calc non Af Amer 46 (*)    GFR calc Af  Amer 74 (*)    All other components within normal limits  DIFFERENTIAL  POCT I-STAT TROPONIN I  PRO B NATRIURETIC PEPTIDE   Dg Chest 2 View  07/10/2011  *RADIOLOGY REPORT*  Clinical Data: Shortness of breath  CHEST - 2 VIEW  Comparison: 09/27/2008  Findings: Left humeral arthroplasty partly visualized.  Evidence of CABG noted.  Moderate enlargement of the cardiomediastinal silhouette noted with vascular congestion but no overt edema.  No pleural effusion.  No acute osseous finding.  IMPRESSION:  Cardiomegaly and vascular congestion but no focal acute abnormality.  Original Report Authenticated By: Harrel Lemon, M.D.     No diagnosis found.   Date: 07/10/2011  Rate: 81  Rhythm: normal sinus rhythm  QRS Axis: left  Intervals: normal  ST/T Wave abnormalities: nonspecific T wave changes  Conduction Disutrbances:none  Narrative Interpretation:  T wave inversion AVL  Old EKG Reviewed: unchanged  4:25 PM Discussed patient with Dr. Thedore Mins.  He reports that he will admit patient to a telemetry bed.  MDM  Patient with two episodes of syncope in the past week.  Negative CT scan.  Negative cardiac enzymes.  Normal EKG.  Orthostatic vitals not demonstrating orthostatic hypotension.  Will admit patient for further work up of syncope.        Pascal Lux McDougal, PA-C 07/11/11 1845

## 2011-07-10 NOTE — ED Notes (Signed)
Family at bedside. Reports decrease energy for 2 months.

## 2011-07-10 NOTE — ED Notes (Signed)
Attempted to call report to floor.  Denequal, RN unable to take report at this time.  Informed this RN she will call back ASAP when floor pt care is completed.

## 2011-07-10 NOTE — ED Notes (Signed)
Pt reports did not feel well in December. Was told he was in heart failure and given lasix. Pt had syncopal episode on Tuesday and today.

## 2011-07-10 NOTE — ED Provider Notes (Signed)
Pt reports recent fatigue with syncope Denies CP/SOB currently He reports recent diagnosis of CHF Will need further evaluation for syncope in setting of CHF BP 152/87  Pulse 91  Temp(Src) 98 F (36.7 C) (Oral)  Resp 18  SpO2 94%   Joya Gaskins, MD 07/10/11 1316

## 2011-07-10 NOTE — H&P (Signed)
Jamahl Bevis ZOX:[id number]  Outpatient Primary MD for the patient is No primary provider on file.  With History of -  Past Medical History  Diagnosis Date  . Myocardial infarction   . Kidney stones   . Coronary artery disease   . CHF (congestive heart failure)       Past Surgical History  Procedure Date  . Shoulder surgery   . Coronary artery bypass graft     in for   Chief Complaint  Patient presents with  . Near Syncope     HPI  [name]  is a 66 y.o. male, with history of hypothyroidism, recent diagnosis of CHF, CAD, CK D. stage III, who was started on Lasix one month ago on a when necessary basis, comes in with an episode of syncope and collapse. This episode happened early this morning at home, patient was doing some household chores he felt lightheaded and subsequently passed out and lost consciousness for 5 minutes. His wife was around and she confirms this episode. There was no seizure-like activity there was no bowel or bladder incontinence, no tongue bites or head injury. Patient also states that he had a similar episode one week ago also. Denies any aura or flashes, denies any palpitations denies any chest pain cough shortness of breath. Denies any diarrhea. No new medications besides 20 mg of Lasix on an as needed basis for edema. Currently is edema free. Patient is currently symptom-free. Unremarkable head CT chest x-ray and EKG.    Review of Systems    In addition to the HPI above,   No Fever-chills, No Headache, No changes with Vision or hearing, No problems swallowing food or Liquids, No Chest pain, Cough or Shortness of Breath, No Abdominal pain, No Nausea or Vommitting, Bowel movements are regular, No Blood in stool or Urine, No dysuria, No new skin rashes or bruises, No new joints pains-aches,  No new weakness, tingling, numbness in any extremity, No recent weight gain or loss, No polyuria, polydypsia or polyphagia, No  significant Mental Stressors.  A full 10 point Review of Systems was done, except as stated above, all other Review of Systems were negative.   Social History History  Substance Use Topics  . Smoking status: Never Smoker   . Smokeless tobacco: Not on file  . Alcohol Use: No      Family History CAD-father  Medication List  As of 07/10/2011  4:46 PM   ASK your doctor about these medications         acetaminophen 500 MG tablet   Commonly known as: TYLENOL   Take 500 mg by mouth every 6 (six) hours as needed. For pain      carvedilol 6.25 MG tablet   Commonly known as: COREG   Take 3.125 mg by mouth daily.      doxycycline 100 MG capsule   Commonly known as: VIBRAMYCIN   Take 100 mg by mouth 2 (two) times daily.      FLUoxetine 20 MG capsule   Commonly known as: PROZAC   Take 20 mg by mouth daily.      furosemide 20 MG tablet   Commonly known as: LASIX   Take 20 mg by mouth daily.      ibuprofen 400 MG tablet   Commonly known as: ADVIL,MOTRIN   Take 400 mg by mouth every 6 (six) hours as needed. For headaches      levothyroxine 50 MCG tablet   Commonly known as: SYNTHROID, LEVOTHROID  Take 50 mcg by mouth daily.      metoprolol succinate 50 MG 24 hr tablet   Commonly known as: TOPROL-XL   Take 25 mg by mouth daily. Take with or immediately following a meal.      multivitamins ther. w/minerals Tabs   Take 1 tablet by mouth daily.      nitroGLYCERIN 0.4 MG SL tablet   Commonly known as: NITROSTAT   Place 0.4 mg under the tongue every 5 (five) minutes as needed. For chest pain      omega-3 acid ethyl esters 1 G capsule   Commonly known as: LOVAZA   Take 1 g by mouth 2 (two) times daily.      ranitidine 300 MG tablet   Commonly known as: ZANTAC   Take 300 mg by mouth at bedtime.      simvastatin 80 MG tablet   Commonly known as: ZOCOR   Take 40 mg by mouth at bedtime.      Tamsulosin HCl 0.4 MG Caps   Commonly known as: FLOMAX   Take 0.4 mg by mouth.       zolpidem 10 MG tablet   Commonly known as: AMBIEN   Take 10 mg by mouth at bedtime as needed.           Allergies  Allergen Reactions  . Sulfa Antibiotics     Physical Exam  Vitals  Blood pressure 112/62, pulse 88, temperature 98.1 F (36.7 C), temperature source Oral, resp. rate 13, SpO2 96.00%.   1. General middle aged white lying in bed in NAD,   2. Normal affect and insight, Not Suicidal or Homicidal, Awake Alert, Oriented *3.  3. No F.N deficits, ALL C.Nerves Intact, Strength 5/5 all 4 extremities, Sensation intact all 4 extremities, Plantars down going.  4. Ears and Eyes appear Normal, Conjunctivae clear, PERRLA. Moist Oral Mucosa.  5. Supple Neck, No JVD, No cervical lymphadenopathy appriciated, No Carotid Bruits.  6. Symmetrical Chest wall movement, Good air movement bilaterally, CTAB.  7. RRR, No Gallops, Rubs or Murmurs, No Parasternal Heave.  8. Positive Bowel Sounds, Abdomen Soft, Non tender, No organomegaly appriciated,       No rebound -guarding or rigidity.  9.  No Cyanosis, Normal Skin Turgor, No Skin Rash or Bruise.  10. Good muscle tone,  joints appear normal , no effusions, Normal ROM.  11. No Palpable Lymph Nodes in Neck or Axillae     Data Review  CBC  Lab 07/10/11 1321  WBC 8.5  HGB 14.0  HCT 41.1  PLT 207  MCV 98.6  MCH 33.6  MCHC 34.1  RDW 13.3  LYMPHSABS 1.1  MONOABS 0.8  EOSABS 0.1  BASOSABS 0.0  BANDABS --   ------------------------------------------------------------------------------------------------------------------ Chemistries   Lab 07/10/11 1321  NA 137  K 4.5  CL 101  CO2 26  GLUCOSE 104*  BUN 19  CREATININE 1.55*  CALCIUM 9.4  MG --  AST --  ALT --  ALKPHOS --  BILITOT --   ------------------------------------------------------------------------------------------------------------------ CrCl is unknown because there is no height on file for the current  visit. ------------------------------------------------------------------------------------------------------------------ No results found for this basename: TSH,T4TOTAL,FREET3,T3FREE,THYROIDAB in the last 72 hours  Coagulation profile No results found for this basename: INR:5,PROTIME:5 in the last 168 hours ------------------------------------------------------------------------------------------------------------------- No results found for this basename: DDIMER:2 in the last 72 hours ------------------------------------------------------------------------------------------------------------------- Cardiac Enzymes No results found for this basename: CK:3,CKMB:3,TROPONINI:3,MYOGLOBIN:3 in the last 168 hours ------------------------------------------------------------------------------------------------------------------ No components found with this basename: POCBNP:3 ------------------------------------------------------------------------------------------------------------------  Imaging results:   Dg Chest 2 View  07/10/2011  *RADIOLOGY REPORT*  Clinical Data: Shortness of breath  CHEST - 2 VIEW  Comparison: 09/27/2008  Findings: Left humeral arthroplasty partly visualized.  Evidence of CABG noted.  Moderate enlargement of the cardiomediastinal silhouette noted with vascular congestion but no overt edema.  No pleural effusion.  No acute osseous finding.  IMPRESSION: Cardiomegaly and vascular congestion but no focal acute abnormality.  Original Report Authenticated By: Harrel Lemon, M.D.   Ct Head Wo Contrast  07/10/2011  *RADIOLOGY REPORT*  Clinical Data: Syncope.  Fatigue.  CT HEAD WITHOUT CONTRAST  Technique:  Contiguous axial images were obtained from the base of the skull through the vertex without contrast.  Comparison: None.  Findings: No mass lesion, mass effect, midline shift, hydrocephalus, hemorrhage.  No territorial ischemia or acute infarction.  Intracranial atherosclerosis is  present.  Mastoid air cells are clear.  Paranasal sinuses demonstrate right sphenoid sinus mucosal thickening.  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  IMPRESSION: Negative CT head.  Original Report Authenticated By: Andreas Newport, M.D.    My personal review of EKG: Rhythm NSR, Rate  90/min, QTc 457 , no Acute ST changes     Assessment & Plan  1. Syncope and collapse - in a patient with CAD, recent diagnosis of CHF who was recently placed on when necessary Lasix - we'll admit the patient on telemetry bed, will check orthostatics first orthostatic in the ER was negative, will check an echogram, EEG of the brain, rule out MI with serial troponins, check carotid duplex. I have also discussed the case with cardiologist Dr. Jacinto Halim she will see the patient in the morning.   2. Recent diagnosis of CHF-patient currently appears compensated has no edema minimal bibasal raise, he's not short of breath, we'll give him one dose of 20 mg Lasix and monitor.   3. CK D. stage III his creatinine is at baseline as compared to one year ago creatinine in the system.   4. Hypothyroidism no acute issues we'll continue the patient on home dose Synthroid and check a TSH.   5. History of coronary artery disease no acute issues home medications to be continued patient is pain-free, EKG unremarkable, will rule out MI with serial troponins.    6.Hypertension - continue Coreg with parameters, Hold Toprol ??.   DVT Prophylaxis Heparin   AM Labs Ordered, also please review Full Orders  Admission, patients condition and plan of care including tests being ordered have been discussed with the patient and wife who indicate understanding and agree with the plan and Code Status.  Code Status Full  Condition Marinell Blight K M.D on 07/10/2011 at 4:30 PM  [facility] Office  [phone]

## 2011-07-10 NOTE — Progress Notes (Signed)
Report received from ED, patient has arrived on unit and VS are stable.  Patient is refusing labs__________D. Manson Passey RN

## 2011-07-10 NOTE — ED Notes (Signed)
Pt with syncopal episode Tuesday and today, was bending over and standing back up, LOC for 3-4 mins, was not observed but heard

## 2011-07-11 ENCOUNTER — Encounter (HOSPITAL_COMMUNITY): Payer: Self-pay | Admitting: Physician Assistant

## 2011-07-11 ENCOUNTER — Inpatient Hospital Stay (HOSPITAL_COMMUNITY): Payer: BC Managed Care – PPO

## 2011-07-11 DIAGNOSIS — R55 Syncope and collapse: Secondary | ICD-10-CM

## 2011-07-11 DIAGNOSIS — I517 Cardiomegaly: Secondary | ICD-10-CM

## 2011-07-11 LAB — CARDIAC PANEL(CRET KIN+CKTOT+MB+TROPI)
CK, MB: 2 ng/mL (ref 0.3–4.0)
CK, MB: 2.2 ng/mL (ref 0.3–4.0)
Relative Index: INVALID (ref 0.0–2.5)
Total CK: 70 U/L (ref 7–232)
Troponin I: <0.3 ng/mL (ref <0.30)

## 2011-07-11 LAB — BASIC METABOLIC PANEL
CO2: 25 mEq/L (ref 19–32)
Chloride: 98 mEq/L (ref 96–112)
GFR calc Af Amer: 52 mL/min — ABNORMAL LOW (ref >90)
Potassium: 3.9 mEq/L (ref 3.5–5.1)

## 2011-07-11 LAB — CBC
HCT: 39.6 % (ref 39.0–52.0)
Hemoglobin: 13.9 g/dL (ref 13.0–17.0)
MCV: 97.1 fL (ref 78.0–100.0)
WBC: 9.1 10*3/uL (ref 4.0–10.5)

## 2011-07-11 NOTE — Progress Notes (Signed)
*  PRELIMINARY RESULTS* Vascular Ultrasound Carotid Duplex (Doppler) has been completed.  Preliminary findings: Bilaterally no significant ICA stenosis with antegrade vertebral flow.  Farrel Demark RDMS 07/11/2011, 4:43 PM

## 2011-07-11 NOTE — Progress Notes (Signed)
Pt back from EEG and doppler w/o any acute distress.  VSS.  Amanda Pea, RN

## 2011-07-11 NOTE — Progress Notes (Addendum)
Unable to obtain pt 's last set pf VS at 1600 VS due after having 2D echo with contrast.   Having EEG  Done of the floor  at this time.   Amanda Pea, RN

## 2011-07-11 NOTE — Progress Notes (Signed)
Subjective: Had some lightheaded earlier. No chest pain no dyspnea.  Objective: Filed Vitals:   07/11/11 1128 07/11/11 1129 07/11/11 1130 07/11/11 1131  BP: 145/79 117/73 145/89 146/80  Pulse: 85 91 91 91  Temp:      TempSrc:      Resp: 20 20 20 20   Height:      Weight:      SpO2:       Weight change:   Intake/Output Summary (Last 24 hours) at 07/11/11 1258 Last data filed at 07/11/11 1217  Gross per 24 hour  Intake   1040 ml  Output   1550 ml  Net   -510 ml    General: Alert, awake, oriented x3, in no acute distress.  HEENT: No bruits, no goiter.  Heart: Regular rate and rhythm, without murmurs, rubs, gallops.  Lungs: CTA, bilateral air movement.  Abdomen: Soft, nontender, nondistended, positive bowel sounds.  Neuro: Grossly intact, nonfocal. Extremities no edema.   Lab Results:  [facility] 07/11/11 0237 07/10/11 1321  NA 134* 137  K 3.9 4.5  CL 98 101  CO2 25 26  GLUCOSE 110* 104*  BUN 18 19  CREATININE 1.57* 1.55*  CALCIUM 9.7 9.4  MG -- --  PHOS -- --    Basename 07/11/11 0237 07/10/11 1321  WBC 9.1 8.5  NEUTROABS -- 6.5  HGB 13.9 14.0  HCT 39.6 41.1  MCV 97.1 98.6  PLT 208 207    Basename 07/11/11 1021 07/11/11 0245  CKTOTAL 70 80  CKMB 2.2 2.0  CKMBINDEX -- --  TROPONINI <0.30 <0.30    Basename 07/10/11 1656  TSH 0.413  T4TOTAL --  T3FREE --  THYROIDAB --   No results found for this basename: VITAMINB12:2,FOLATE:2,FERRITIN:2,TIBC:2,IRON:2,RETICCTPCT:2 in the last 72 hours  Micro Results: No results found for this or any previous visit (from the past 240 hour(s)).  Studies/Results: Dg Chest 2 View  07/10/2011  *RADIOLOGY REPORT*  Clinical Data: Shortness of breath  CHEST - 2 VIEW  Comparison: 09/27/2008  Findings: Left humeral arthroplasty partly visualized.  Evidence of CABG noted.  Moderate enlargement of the cardiomediastinal silhouette noted with vascular congestion but no overt edema.  No pleural effusion.  No acute osseous finding.   IMPRESSION: Cardiomegaly and vascular congestion but no focal acute abnormality.  Original Report Authenticated By: Harrel Lemon, M.D.   Ct Head Wo Contrast  07/10/2011  *RADIOLOGY REPORT*  Clinical Data: Syncope.  Fatigue.  CT HEAD WITHOUT CONTRAST  Technique:  Contiguous axial images were obtained from the base of the skull through the vertex without contrast.  Comparison: None.  Findings: No mass lesion, mass effect, midline shift, hydrocephalus, hemorrhage.  No territorial ischemia or acute infarction.  Intracranial atherosclerosis is present.  Mastoid air cells are clear.  Paranasal sinuses demonstrate right sphenoid sinus mucosal thickening.  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  IMPRESSION: Negative CT head.  Original Report Authenticated By: Andreas Newport, M.D.    Medications: I have reviewed the patient's current medications.   Patient Active Hospital Problem List:  Syncope and collapse (07/10/2011) No orthostatic. ECHO, Doppler pending. CT head negative, troponin time 2  Dr Jacinto Halim to see patient today.   CHF (congestive heart failure) () No evidence of acute Heart failure exacerbation.   Coronary artery disease () Aspirin, COREG.  Patient was also on metoprolol ??  CKD (chronic kidney disease), stage III (07/10/2011) Cr stable.  Hypothyroid (07/10/2011) Continue with synthroid.      LOS: 1 day  REGALADO,BELKYS M.D.  [facility] 07/11/2011, 12:58 PM

## 2011-07-11 NOTE — Progress Notes (Signed)
Othorstatic not done at this time, pt having a test.  Amanda Pea, RN

## 2011-07-11 NOTE — Progress Notes (Signed)
Utilization review complete 

## 2011-07-11 NOTE — Progress Notes (Signed)
   CARE MANAGEMENT NOTE 07/11/2011  Patient:  [name], [name]   Account Number:  [id number]  Date Initiated:  07/11/2011  Documentation initiated by:  Donn Pierini  Subjective/Objective Assessment:   Pt admitted with syncope     Action/Plan:   PTA pt lived at home with spouse, was independent with ADLs   Anticipated DC Date:  07/13/2011   Anticipated DC Plan:  HOME/SELF CARE      DC Planning Services  CM consult      Choice offered to / List presented to:             Status of service:  In process, will continue to follow Medicare Important Message given?   (If response is "NO", the following Medicare IM given date fields will be blank) Date Medicare IM given:   Date Additional Medicare IM given:    Discharge Disposition:    Per UR Regulation:    Comments:  PCP- Dr. Mack Guise  07/11/11- 1500- Donn Pierini RN, BSN [phone] Spoke with pt at bedside per conversation pt states that his PCP is Dr. Mack Guise and that he was independent at home with his wife PTA. CM to follow for any potential d/c needs

## 2011-07-11 NOTE — ED Provider Notes (Signed)
Medical screening examination/treatment/procedure(s) were conducted as a shared visit with non-physician practitioner(s) and myself.  I personally evaluated the patient during the encounter  2 episodes of postural syncope, from standing position, occurred while bending over. Today with LOC x 4 minutes per wife.  No tongue biting or incontinence.  No CP or SOB.  Hx CHF with suspected ICM followed at [place].  Neg orthostatics here.  No murmur suggestive of AS.  Glynn Octave, MD 07/11/11 Windell Moment

## 2011-07-11 NOTE — Consult Note (Signed)
Cardiology Consult Note   Patient ID: [name] MRN: [id number], DOB/AGE: 65/01/1946   Admit date: 07/10/2011 Date of Consult: 07/11/2011  Primary Physician: No primary provider on file. Primary Cardiologist: Jerral Bonito, MD  Pt. Profile: [name] is a 66 yo Caucasian male with PMHx significant for CAD (s/p CABG x 3 in 04/1999 and cath in 08/1999 no critical lesions, medically managed), ICM (unspecified CHF diagnosed [facility]), hx of prior mechanical fall (09/2008 with rib fractures), hypothyroidism, HL, HTN and obesity who was admitted to [facility] hospital after having a syncope episode.   Past Cardiac History  As listed in the patient profile and problem list.  Adenosine cardiolite stress 06/2000: fixed anteroseptal defect and at apex consistent with old infarct, no evidence of ischemia. EF 34% with diffuse hypokinesia and akinesia in the anteroseptal region.   Reason for consult: syncope  Problem List: Past Medical History  Diagnosis Date  . Myocardial infarction   . Nephrolithiasis   . CHF (congestive heart failure)   . CAD (coronary artery disease), native coronary artery     s/p CABG x 3 in 04/1999 and catheterization 08/1999, LAD totaled after diagonal 2, 70-80% before LIMA, D1 50%, D2 50% ramus intermediate, mild, circumflex okay, OM-1 moderate to severe, diffuse, OM-2 totaled, RCA dominant, 20-30%, grafts x 3 patent, EF 35%  . Hyperlipidemia   . Hypothyroidism   . Hypertension   . CKD (chronic kidney disease) stage 3, GFR 30-59 ml/min     Past Surgical History  Procedure Date  . Shoulder surgery   . Coronary artery bypass graft 04/1999    LIMA-LAD, SVG-D2, SVG-OM2  . Rhinoplasty   . Orif finger fracture 08/2003    Irrigation and debridement, open reduction and internal fixation of open fracture to proximal phalynx , right hand, 5th digit  . Nephrolithotomy 06/2005 and 11/2009    Left    Allergies:  Allergies  Allergen Reactions  .  Sulfa Antibiotics    HPI:   Yesterday morning, he reportedly bent over, felt lightheaded and woke up on the floor. This has happened twice within the last week, both times in the morning prior to eating breakfast. These episodes occur soon after standing. This has not occurred while standing. He denies instigating events besides bending over. His wife witnessed the most recent episode yesterday and Dunn he was unconscious for about 4-5 minutes. No reported seizure activity- involuntary movements, biting of tongue or incontinence. He reports fatigue and presyncope the last 3-4 months leading up to this event with associated decreased activity level. He is usually able to do household chores and retrieve the mail; however, has been unable to for the past 3-4 months. He denies chest pain, palpitations, shortness of breath during the episodes or leading up to last week.  No family history of syncope. No orthopnea, LE edema, PND. He Dunn he does not drink much water. And does not always eat 3 meals a day, occasionally skipping lunch.   Of note, he recently went to the [facility] for weakness 12/12. He Dunn that he had a CXR, which he said was normal, and was given Lasix PO. He does not remember having any other studies performed.   Upon presenting to the ED, CXR revealed cardiomegaly and vascular congestion without overt edema or pleural effusion. CT head negative. EKG without evidence of ischemia. CE neg x 2, TnI neg x 3. TSH WNL. BNP elevated to 3324. Orthostatic vital signs WNL yesterday, positive today.  Inpatient Medications:     . aspirin EC  81 mg Oral Daily  . carvedilol  3.125 mg Oral Daily  . doxycycline  100 mg Oral Q12H  . famotidine  10 mg Oral Daily  . FLUoxetine  20 mg Oral Daily  . furosemide  20 mg Oral Once  . heparin  5,000 Units Subcutaneous Q8H  . levothyroxine  50 mcg Oral QAC breakfast  . multivitamins ther. w/minerals  1 tablet Oral Daily  . omega-3 acid ethyl esters  1  g Oral BID  . rosuvastatin  20 mg Oral Daily  . sodium chloride  3 mL Intravenous Q12H  . Tamsulosin HCl  0.4 mg Oral Daily  . zolpidem  5 mg Oral QHS  . DISCONTD: doxycycline  100 mg Oral BID   Prescriptions prior to admission  Medication Sig Dispense Refill  . acetaminophen (TYLENOL) 500 MG tablet Take 500 mg by mouth every 6 (six) hours as needed. For pain      . carvedilol (COREG) 6.25 MG tablet Take 3.125 mg by mouth daily.      Marland Kitchen doxycycline (VIBRAMYCIN) 100 MG capsule Take 100 mg by mouth 2 (two) times daily.      Marland Kitchen FLUoxetine (PROZAC) 20 MG capsule Take 20 mg by mouth daily.      . furosemide (LASIX) 20 MG tablet Take 20 mg by mouth daily.      Marland Kitchen ibuprofen (ADVIL,MOTRIN) 400 MG tablet Take 400 mg by mouth every 6 (six) hours as needed. For headaches      . levothyroxine (SYNTHROID, LEVOTHROID) 50 MCG tablet Take 50 mcg by mouth daily.      . metoprolol succinate (TOPROL-XL) 50 MG 24 hr tablet Take 25 mg by mouth daily. Take with or immediately following a meal.      . Multiple Vitamins-Minerals (MULTIVITAMINS THER. W/MINERALS) TABS Take 1 tablet by mouth daily.      . nitroGLYCERIN (NITROSTAT) 0.4 MG SL tablet Place 0.4 mg under the tongue every 5 (five) minutes as needed. For chest pain      . omega-3 acid ethyl esters (LOVAZA) 1 G capsule Take 1 g by mouth 2 (two) times daily.      . ranitidine (ZANTAC) 300 MG tablet Take 300 mg by mouth at bedtime.      . simvastatin (ZOCOR) 80 MG tablet Take 40 mg by mouth at bedtime.      . Tamsulosin HCl (FLOMAX) 0.4 MG CAPS Take 0.4 mg by mouth.      . zolpidem (AMBIEN) 10 MG tablet Take 10 mg by mouth at bedtime as needed.       Family History  Problem Relation Age of Onset  . Coronary artery disease Father     History   Social History  . Marital Status: Married    Spouse Name: N/A    Number of Children: 2  . Years of Education: N/A   Occupational History  . Not on file.   Social History Main Topics  . Smoking status: Never  Smoker   . Smokeless tobacco: Not on file  . Alcohol Use: No  . Drug Use: No  . Sexually Active:    Other Topics Concern  . Not on file   Social History Narrative   Lives in [place], [place] with wife.     Review of Systems: General: negative for chills, fever, night sweats or weight changes.  Cardiovascular: negative for chest pain, dyspnea on exertion, edema, orthopnea, palpitations, paroxysmal nocturnal  dyspnea or shortness of breath Dermatological: negative for rash Respiratory: negative for cough or wheezing Urologic: negative for hematuria Abdominal: negative for nausea, vomiting, diarrhea, bright red blood per rectum, melena, or hematemesis Neurologic: positive for syncope, lightheadedness negative for visual changes All other systems reviewed and are otherwise negative except as noted above.  Physical Exam: Blood pressure 146/80, pulse 91, temperature 98 F (36.7 C), temperature source Oral, resp. rate 20, height 5\' 10"  (1.778 m), weight 116.4 kg (256 lb 9.9 oz), SpO2 96.00%.    General: Obese, NAD Head: Normocephalic, atraumatic, sclera non-icteric, no xanthomas Neck: Supple. Negative for carotid bruits. JVD mildly elevated, however difficult to appreciate due to body habitus Lungs: CTAB without wheezes, rales, or rhonchi. Breathing is unlabored. Heart: RRR with S1 S2. No murmurs, rubs, or gallops appreciated. Abdomen: Soft, non-tender, non-distended with normoactive bowel sounds. No hepatomegaly. No rebound/guarding. No obvious abdominal masses. Msk:  Strength and tone appears normal for age. Extremities: No clubbing, cyanosis or edema.  Distal pedal pulses are 2+ and equal bilaterally. Neuro: Alert and oriented X 3. Moves all extremities spontaneously. Psych:  Responds to questions appropriately with a normal affect.  Labs: Recent Labs  Basename 07/11/11 0237 07/10/11 1321   WBC 9.1 8.5   HGB 13.9 14.0   HCT 39.6 41.1   MCV 97.1 98.6   PLT 208 207    Lab  07/11/11 0237 07/10/11 1321  NA 134* 137  K 3.9 4.5  CL 98 101  CO2 25 26  BUN 18 19  CREATININE 1.57* 1.55*  CALCIUM 9.7 9.4  PROT -- --  BILITOT -- --  ALKPHOS -- --  ALT -- --  AST -- --  AMYLASE -- --  LIPASE -- --  GLUCOSE 110* 104*   Recent Labs  Basename 07/11/11 1021 07/11/11 0245   CKTOTAL 70 80   CKMB 2.2 2.0   CKMBINDEX -- --   TROPONINI <0.30 <0.30   POC TnI: 0.01  Basename 07/10/11 1656  TSH 0.413  T4TOTAL --  T3FREE --  THYROIDAB --   Radiology/Studies: Dg Chest 2 View  07/10/2011  *RADIOLOGY REPORT*  Clinical Data: Shortness of breath  CHEST - 2 VIEW  Comparison: 09/27/2008  Findings: Left humeral arthroplasty partly visualized.  Evidence of CABG noted.  Moderate enlargement of the cardiomediastinal silhouette noted with vascular congestion but no overt edema.  No pleural effusion.  No acute osseous finding.  IMPRESSION: Cardiomegaly and vascular congestion but no focal acute abnormality.  Original Report Authenticated By: Harrel Lemon, M.D.   Ct Head Wo Contrast  07/10/2011  *RADIOLOGY REPORT*  Clinical Data: Syncope.  Fatigue.  CT HEAD WITHOUT CONTRAST  Technique:  Contiguous axial images were obtained from the base of the skull through the vertex without contrast.  Comparison: None.  Findings: No mass lesion, mass effect, midline shift, hydrocephalus, hemorrhage.  No territorial ischemia or acute infarction.  Intracranial atherosclerosis is present.  Mastoid air cells are clear.  Paranasal sinuses demonstrate right sphenoid sinus mucosal thickening.  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  IMPRESSION: Negative CT head.  Original Report Authenticated By: Andreas Newport, M.D.   EKG: 07/10/2011: NSR 90 bpm, LVH, LAE, no ST-T wave changes  ASSESSMENT AND PLAN:   1. Syncope- he has a 3-4 month history of fatigue and presyncope leading up to these events which are positional in nature occuring soon after standing and upon bending over. This is  suspicious for orthostatic changes. Given patient's cardiac history, decreased  cardiac output with worsening systolic dysfunction is also concerning. Patient also notes that he had not eaten prior to each episode which may certainly contribute. No evidence of acute stroke, hypothyroidism or cardiac ischemia on admission. Orthostatics drawn today were positive at 145-151/79-87 lying and 117/73 sitting. Patient was recently prescribed Lasix PO. Additionally, he was on two BBs outpatient. This could certainly worsen orthostasis to the point of syncope.   - Will review 2D echo  - Will follow additional studies ordered per IM- EEG, carotid dopplers  2. ICM- presumed to be etiology of unspecified CHF recently diagnosed at the [facility] given CAD history. Likely systolic- EF 35% on cath and stress test most recently in 2002. He reports fatigue, weakness and presyncope x 3-4 months leading up to the syncopal episodes; however denies symptoms due to fluid overload or cardiac ischemia. TnI neg x 3, EKG unremarkable. pBNP elevated on admission; given Lasix 20mg  PO once yesterday. No evidence of fluid overload, except ? JVD on exam. Will discuss preference of further ischemic work-up with Dr. Riley Kill pending echo.   - Continue holding Lasix and Toprol today  - Not an ACEI candidate given renal insufficiency  - Review 2D echo  - EP consult for ICD consideration and pending echo results  - May also pursue further ischemic work-up given patient's cardiac history and loss to follow-up    Signed, R. Hurman Horn, PA-C 07/11/2011, 1:41 PM  Agree with note of [name].  Patient presents with what sounds like postural related hypotension and syncope in the back ground of what likely is significant ischemic cardiomyopathy.  His ortho BP are abnormal, but not dramatic.  He denies chest pain or significant arrhytmic sounding symptoms.  When he stands up, then bends over is when his syncope occurred.  He was on two beta  blockers.    Agree with aboe. Might be worthwhile considering an ACE if he is followed closely. Would add support hose like Jobst stockings as a helpful measure. He needs 2D echo to assess LV dysfunction. He needs to decide [facility];  Regarding possible cath and EP evaluation, will wait on 2D echo to decide, but clearly this likely needs to be addressed although not directly the cause of his current symptoms.    Shawnie Pons 2:23 PM 07/11/2011

## 2011-07-11 NOTE — Progress Notes (Signed)
*  PRELIMINARY RESULTS* Echocardiogram 2D Echocardiogram has been performed.  Glean Salen [facility] 07/11/2011, 3:07 PM

## 2011-07-12 DIAGNOSIS — I428 Other cardiomyopathies: Secondary | ICD-10-CM

## 2011-07-12 DIAGNOSIS — R55 Syncope and collapse: Principal | ICD-10-CM

## 2011-07-12 DIAGNOSIS — I429 Cardiomyopathy, unspecified: Secondary | ICD-10-CM | POA: Diagnosis present

## 2011-07-12 LAB — BASIC METABOLIC PANEL
BUN: 21 mg/dL (ref 6–23)
Calcium: 9.4 mg/dL (ref 8.4–10.5)
Creatinine, Ser: 1.45 mg/dL — ABNORMAL HIGH (ref 0.50–1.35)
GFR calc non Af Amer: 49 mL/min — ABNORMAL LOW (ref >90)
Glucose, Bld: 98 mg/dL (ref 70–99)

## 2011-07-12 NOTE — Discharge Summary (Signed)
Admit date: 07/10/2011 Discharge date: 07/12/2011  Primary Care Physician: Marcy Panning, [place].   Discharge Diagnoses:    . Syncope and collapse. Orthostatic ?  07/10/2011   . Cardiomyopathy Ef 30 % 07/12/2011   . CKD (chronic kidney disease), stage III 07/10/2011   . Hypothyroid 07/10/2011   . Myocardial infarction    . Kidney stones    . CHF (congestive heart failure), Systolic Compensated.     . Coronary artery disease               DISCHARGE MEDICATION: Medication List  As of 07/12/2011  2:55 PM   STOP taking these medications         furosemide 20 MG tablet      ibuprofen 400 MG tablet      metoprolol succinate 50 MG 24 hr tablet         TAKE these medications         acetaminophen 500 MG tablet   Commonly known as: TYLENOL   Take 500 mg by mouth every 6 (six) hours as needed. For pain      carvedilol 6.25 MG tablet   Commonly known as: COREG   Take 3.125 mg by mouth daily.      doxycycline 100 MG capsule   Commonly known as: VIBRAMYCIN   Take 100 mg by mouth 2 (two) times daily.      FLUoxetine 20 MG capsule   Commonly known as: PROZAC   Take 20 mg by mouth daily.      levothyroxine 50 MCG tablet   Commonly known as: SYNTHROID, LEVOTHROID   Take 50 mcg by mouth daily.      multivitamins ther. w/minerals Tabs   Take 1 tablet by mouth daily.      nitroGLYCERIN 0.4 MG SL tablet   Commonly known as: NITROSTAT   Place 0.4 mg under the tongue every 5 (five) minutes as needed. For chest pain      omega-3 acid ethyl esters 1 G capsule   Commonly known as: LOVAZA   Take 1 g by mouth 2 (two) times daily.      ranitidine 300 MG tablet   Commonly known as: ZANTAC   Take 300 mg by mouth at bedtime.      simvastatin 80 MG tablet   Commonly known as: ZOCOR   Take 40 mg by mouth at bedtime.      Tamsulosin HCl 0.4 MG Caps   Commonly known as: FLOMAX   Take 0.4 mg by mouth.      zolpidem 10 MG tablet   Commonly known as: AMBIEN   Take 10 mg by mouth at  bedtime as needed.              Consults: Treatment Team:  Pamella Pert, MD [facility], MD   SIGNIFICANT DIAGNOSTIC STUDIES:  Dg Chest 2 View  07/10/2011  *RADIOLOGY REPORT*  Clinical Data: Shortness of breath  CHEST - 2 VIEW  Comparison: 09/27/2008  Findings: Left humeral arthroplasty partly visualized.  Evidence of CABG noted.  Moderate enlargement of the cardiomediastinal silhouette noted with vascular congestion but no overt edema.  No pleural effusion.  No acute osseous finding.  IMPRESSION: Cardiomegaly and vascular congestion but no focal acute abnormality.  Original Report Authenticated By: Harrel Lemon, M.D.   Ct Head Wo Contrast  07/10/2011  *RADIOLOGY REPORT*  Clinical Data: Syncope.  Fatigue.  CT HEAD WITHOUT CONTRAST  Technique:  Contiguous axial images were obtained  from the base of the skull through the vertex without contrast.  Comparison: None.  Findings: No mass lesion, mass effect, midline shift, hydrocephalus, hemorrhage.  No territorial ischemia or acute infarction.  Intracranial atherosclerosis is present.  Mastoid air cells are clear.  Paranasal sinuses demonstrate right sphenoid sinus mucosal thickening.  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  IMPRESSION: Negative CT head.  Original Report Authenticated By: Andreas Newport, M.D.     ECHO:   - Left ventricle: The cavity size was moderately dilated. Wall thickness was normal. Systolic function was severely reduced. The estimated ejection fraction was in the range of 25% to 30%. Diffuse hypokinesis. There is akinesis of the anteroseptal myocardium. There is akinesis of the apical myocardium. - Aortic valve: Trivial regurgitation. - Mitral valve: Valve area by pressure half-time: 1.88cm^2. - Left atrium: The atrium was moderately dilated. - Right atrium: The atrium was mildly dilated. - Pulmonary arteries: Systolic pressure was moderately to severely increased. PA peak pressure: 66mm Hg  (S).    BRIEF ADMITTING H & P: [name] is a 66 y.o. male, with history of hypothyroidism, recent diagnosis of CHF, CAD, CK D. stage III, who was started on Lasix one month ago on a when necessary basis, comes in with an episode of syncope and collapse. This episode happened early this morning at home, patient was doing some household chores he felt lightheaded and subsequently passed out and lost consciousness for 5 minutes. His wife was around and she confirms this episode. There was no seizure-like activity there was no bowel or bladder incontinence, no tongue bites or head injury. Patient also states that he had a similar episode one week ago also. Denies any aura or flashes, denies any palpitations denies any chest pain cough shortness of breath. Denies any diarrhea. No new medications besides 20 mg of Lasix on an as needed basis for edema. Currently is edema free. Patient is currently symptom-free. Unremarkable head CT chest x-ray and EKG.  Hospital Course:  Syncope and collapse:  Patient presented with history of orthostatic hypotension, syncope. His orthostatic were mildly positive. Patient was recently started on lasix. On his medications list  He had coreg and metoprolol. He was not sure if he was taking both. We stop metoprolol, and lasix. Repeated orthostatic were negative. ECHO showed Cardiomyopathy Ef 25-30 %. Cardio was consulted. No evidence of Arrythmia on telemetry. Cardiologist  recommend consideration for ICD. [facility]. [facility]. [facility] with [facility]. CT head negative, Cardiac enzymes negative.    Compensated Systolic CHF: No evidence of acute Heart failure exacerbation. Ef 30 %. With  Orthostatic vital  positive, and with mild renal insufficiency ACE was not started during this admission. Patient will need to follow up with PCP to consider start ACE. He will need very close follow up.     Coronary artery disease: Continue with Aspirin and  COREG.   CKD (chronic kidney disease), stage III: Patient has history of CKD stage III, Cr baseline 1.5.  His Cr was 1.5 three years ago.   Hypothyroid: Continue with synthroid.      Disposition and Follow-up: [facility] contact the [facility], Patient PCP. The [facility] will [facility] with appointment.   Discharge Orders    Future Orders Please Complete By Expires   Diet - low sodium heart healthy      Increase activity slowly  DISCHARGE EXAM:   General: Alert, awake, oriented x3, in no acute distress.  HEENT: No bruits, no goiter.  Heart: Regular rate and rhythm, without murmurs, rubs, gallops.  Lungs: CTA, bilateral air movement.  Abdomen: Soft, nontender, nondistended, positive bowel sounds.  Neuro: Grossly intact, nonfocal.  Extremities no edema.   Blood pressure 136/78, pulse 87, temperature 98 F (36.7 C), temperature source Oral, resp. rate 20, height 5\' 10"  (1.778 m), weight 118.162 kg (260 lb 8 oz), SpO2 96.00%.   Basename 07/12/11 0555 07/11/11 0237  NA 135 134*  K 3.9 3.9  CL 98 98  CO2 25 25  GLUCOSE 98 110*  BUN 21 18  CREATININE 1.45* 1.57*  CALCIUM 9.4 9.7  MG -- --  PHOS -- --    Basename 07/11/11 0237 07/10/11 1321  WBC 9.1 8.5  NEUTROABS -- 6.5  HGB 13.9 14.0  HCT 39.6 41.1  MCV 97.1 98.6  PLT 208 207    Signed: Sumayyah Custodio M.D. 07/12/2011, 2:55 PM

## 2011-07-12 NOTE — Procedures (Signed)
EEG ID:  [id number].  HISTORY:  This is a 66 year old man with syncope.  MEDICATIONS:  No anticonvulsant medications.  CONDITION OF RECORDING:  This 16-lead EEG was recorded with the patient in awake and drowsy states.  Background rhythms: background patterns in wakefulness were well-organized with a well-sustained posterior dominant rhythm of 9.5 Hz, symmetrical and reactive to eye opening and closing. Drowsiness was associated with mild attenuation of voltage and slowing of  frequencies.  Abnormal potentials: no epileptiform activity or focal slowing was noted.  ACTIVATION PROCEDURES:  Hyperventilation was not performed.  Photic stimulation did not activate tracing.  EKG:  Single-channel of EKG monitoring detected an irregular rhythm.  IMPRESSION:  This is a normal awake and drowsy EEG.  A normal EEG does not rule out the clinical diagnosis of epilepsy. If clinically warranted, a repeat extended EEG or ambulatory requiring may be obtained for prolonged recording times and sleep capture, which may increase the diagnostic yield.  Clinical correlation is suggested.          ______________________________ Carmell Austria, MD    [id number] D:  07/11/2011 17:01:38  T:  07/12/2011 00:29:43  Job #:  [id number]

## 2011-07-12 NOTE — Progress Notes (Signed)
Patient ID: [name], male   DOB: 10-26-1945, 66 y.o.   MRN: [id number] SUBJECTIVE:  2-D echo from yesterday reveals ejection fraction 25-30%. The patient is not having any chest pain or shortness of breath  Filed Vitals:   07/12/11 0828 07/12/11 0829 07/12/11 0830 07/12/11 0831  BP: 134/81 136/77 132/82 134/82  Pulse: 87 87 86 87  Temp:      TempSrc:      Resp: 20 20 20 20   Height:      Weight:      SpO2:        Intake/Output Summary (Last 24 hours) at 07/12/11 1157 Last data filed at 07/12/11 9147  Gross per 24 hour  Intake   1420 ml  Output    700 ml  Net    720 ml    LABS: Basic Metabolic Panel:  Basename 07/12/11 0555 07/11/11 0237  NA 135 134*  K 3.9 3.9  CL 98 98  CO2 25 25  GLUCOSE 98 110*  BUN 21 18  CREATININE 1.45* 1.57*  CALCIUM 9.4 9.7  MG -- --  PHOS -- --   Liver Function Tests: No results found for this basename: AST:2,ALT:2,ALKPHOS:2,BILITOT:2,PROT:2,ALBUMIN:2 in the last 72 hours No results found for this basename: LIPASE:2,AMYLASE:2 in the last 72 hours CBC:  Basename 07/11/11 0237 07/10/11 1321  WBC 9.1 8.5  NEUTROABS -- 6.5  HGB 13.9 14.0  HCT 39.6 41.1  MCV 97.1 98.6  PLT 208 207   Cardiac Enzymes:  Basename 07/11/11 1021 07/11/11 0245  CKTOTAL 70 80  CKMB 2.2 2.0  CKMBINDEX -- --  TROPONINI <0.30 <0.30   BNP: No components found with this basename: POCBNP:3 D-Dimer: No results found for this basename: DDIMER:2 in the last 72 hours Hemoglobin A1C: No results found for this basename: HGBA1C in the last 72 hours Fasting Lipid Panel: No results found for this basename: CHOL,HDL,LDLCALC,TRIG,CHOLHDL,LDLDIRECT in the last 72 hours Thyroid Function Tests:  Basename 07/10/11 1656  TSH 0.413  T4TOTAL --  T3FREE --  THYROIDAB --    RADIOLOGY: Dg Chest 2 View  07/10/2011  *RADIOLOGY REPORT*  Clinical Data: Shortness of breath  CHEST - 2 VIEW  Comparison: 09/27/2008  Findings: Left humeral arthroplasty partly  visualized.  Evidence of CABG noted.  Moderate enlargement of the cardiomediastinal silhouette noted with vascular congestion but no overt edema.  No pleural effusion.  No acute osseous finding.  IMPRESSION: Cardiomegaly and vascular congestion but no focal acute abnormality.  Original Report Authenticated By: Harrel Lemon, M.D.   Ct Head Wo Contrast  07/10/2011  *RADIOLOGY REPORT*  Clinical Data: Syncope.  Fatigue.  CT HEAD WITHOUT CONTRAST  Technique:  Contiguous axial images were obtained from the base of the skull through the vertex without contrast.  Comparison: None.  Findings: No mass lesion, mass effect, midline shift, hydrocephalus, hemorrhage.  No territorial ischemia or acute infarction.  Intracranial atherosclerosis is present.  Mastoid air cells are clear.  Paranasal sinuses demonstrate right sphenoid sinus mucosal thickening.  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  IMPRESSION: Negative CT head.  Original Report Authenticated By: Andreas Newport, M.D.    PHYSICAL EXAM   Patient is oriented to person time and place. Affect is normal. There is no jugulovenous distention. Lungs are clear. Respiratory effort is nonlabored. Cardiac exam reveals S1 and S2. There no clicks or significant murmurs. The abdomen is soft. There is no peripheral edema.   TELEMETRY:  I have reviewed to telemetry. There  is normal sinus rhythm. He has not had any episodes of bradycardia or tachyarrhythmias.  ASSESSMENT AND PLAN:  Principal Problem:  *Syncope and collapse At this point we have no proof of either bradycardia or tachycardia arrhythmia. Early followup with his cardiologist at the [facility] will be very important. Having had this event and knowing has significant LV dysfunction, consideration will have to be given to the placement of an ICD.   CHF (congestive heart failure)  Compensated at this time   Coronary artery disease  Kidney stones  CKD (chronic kidney disease), stage III  Hypothyroid    Cardiomyopathy  EF currently 25-30% Over time will be optimal to titrate medications for his left ventricular dysfunction. He will also need consideration for ICD. He will have to decide if he wants of his care here or at the [place].  I have spoken with him about his cardiology followup. I know him very well from years past. [facility]. For now we need to be sure that information is sent with him for early [place] followup with the cardiology team there.  Willa Rough 07/12/2011 11:57 AM

## 2011-07-14 MED FILL — Perflutren Lipid Microsphere IV Susp 6.52 MG/ML: INTRAVENOUS | Qty: 2 | Status: AC

## 2013-04-16 ENCOUNTER — Encounter (HOSPITAL_COMMUNITY): Payer: Self-pay | Admitting: Emergency Medicine

## 2013-04-16 ENCOUNTER — Emergency Department (INDEPENDENT_AMBULATORY_CARE_PROVIDER_SITE_OTHER)
Admission: EM | Admit: 2013-04-16 | Discharge: 2013-04-16 | Disposition: A | Discharge status: Home / Self Care | Payer: BC Managed Care – PPO | Source: Home / Self Care | Attending: Family Medicine | Admitting: Family Medicine

## 2013-04-16 DIAGNOSIS — J019 Acute sinusitis, unspecified: Secondary | ICD-10-CM

## 2013-04-16 MED ORDER — MINOCYCLINE HCL 100 MG PO CAPS: 100.0000 mg | ORAL_CAPSULE | Freq: Two times a day (BID) | ORAL | Status: DC

## 2013-04-16 MED ORDER — IPRATROPIUM BROMIDE 0.06 % NA SOLN
2.0000 | Freq: Four times a day (QID) | NASAL | Status: DC
Start: 2013-04-16 — End: 2019-11-13

## 2013-04-16 NOTE — ED Notes (Signed)
C/o sore throat and congestion x 6 days. Denies fever, n/v/d. Pt has used tylenol cold  with no relief in symptoms.

## 2013-04-16 NOTE — ED Provider Notes (Signed)
CSN: [id number]     Arrival date & time 04/16/13  1528 History   First MD Initiated Contact with Patient 04/16/13 1619     Chief Complaint  Patient presents with  . Sore Throat   (Consider location/radiation/quality/duration/timing/severity/associated sxs/prior Treatment) Patient is a 67 y.o. male presenting with URI. The history is provided by the patient and the spouse.  URI Presenting symptoms: congestion, cough, rhinorrhea and sore throat   Presenting symptoms: no fever   Severity:  Mild Onset quality:  Gradual Duration:  6 days Progression:  Unchanged Chronicity:  New Risk factors: no recent illness and no sick contacts     Past Medical History  Diagnosis Date  . Myocardial infarction   . Nephrolithiasis   . CHF (congestive heart failure)   . CAD (coronary artery disease), native coronary artery     s/p CABG x 3 in 04/1999 and catheterization 08/1999, LAD totaled after diagonal 2, 70-80% before LIMA, D1 50%, D2 50% ramus intermediate, mild, circumflex okay, OM-1 moderate to severe, diffuse, OM-2 totaled, RCA dominant, 20-30%, grafts x 3 patent, EF 35%  . Hyperlipidemia   . Hypothyroidism   . Hypertension   . CKD (chronic kidney disease) stage 3, GFR 30-59 ml/min    Past Surgical History  Procedure Laterality Date  . Shoulder surgery    . Coronary artery bypass graft  04/1999    LIMA-LAD, SVG-D2, SVG-OM2  . Rhinoplasty    . Orif finger fracture  08/2003    Irrigation and debridement, open reduction and internal fixation of open fracture to proximal phalynx , right hand, 5th digit  . Nephrolithotomy  06/2005 and 11/2009    Left   Family History  Problem Relation Age of Onset  . Coronary artery disease Father    History  Substance Use Topics  . Smoking status: Never Smoker   . Smokeless tobacco: Not on file  . Alcohol Use: No    Review of Systems  Constitutional: Positive for chills. Negative for fever.  HENT: Positive for congestion, postnasal drip,  rhinorrhea, sinus pressure and sore throat.   Respiratory: Positive for cough.   Cardiovascular: Negative.   Gastrointestinal: Negative.   Genitourinary: Negative.     Allergies  Sulfa antibiotics  Home Medications   Current Outpatient Rx  Name  Route  Sig  Dispense  Refill  . carvedilol (COREG) 6.25 MG tablet   Oral   Take 3.125 mg by mouth daily.         Marland Kitchen FLUoxetine (PROZAC) 20 MG capsule   Oral   Take 20 mg by mouth daily.         Marland Kitchen levothyroxine (SYNTHROID, LEVOTHROID) 50 MCG tablet   Oral   Take 50 mcg by mouth daily.         . Multiple Vitamins-Minerals (MULTIVITAMINS THER. W/MINERALS) TABS   Oral   Take 1 tablet by mouth daily.         Marland Kitchen omega-3 acid ethyl esters (LOVAZA) 1 G capsule   Oral   Take 1 g by mouth 2 (two) times daily.         . ranitidine (ZANTAC) 300 MG tablet   Oral   Take 300 mg by mouth at bedtime.         . simvastatin (ZOCOR) 80 MG tablet   Oral   Take 40 mg by mouth at bedtime.         . Tamsulosin HCl (FLOMAX) 0.4 MG CAPS   Oral   Take  0.4 mg by mouth.         . zolpidem (AMBIEN) 10 MG tablet   Oral   Take 10 mg by mouth at bedtime as needed.         Marland Kitchen acetaminophen (TYLENOL) 500 MG tablet   Oral   Take 500 mg by mouth every 6 (six) hours as needed. For pain         . doxycycline (VIBRAMYCIN) 100 MG capsule   Oral   Take 100 mg by mouth 2 (two) times daily.         Marland Kitchen ipratropium (ATROVENT) 0.06 % nasal spray   Nasal   Place 2 sprays into the nose 4 (four) times daily.   15 mL   1   . minocycline (MINOCIN,DYNACIN) 100 MG capsule   Oral   Take 1 capsule (100 mg total) by mouth 2 (two) times daily.   20 capsule   0   . nitroGLYCERIN (NITROSTAT) 0.4 MG SL tablet   Sublingual   Place 0.4 mg under the tongue every 5 (five) minutes as needed. For chest pain          BP 105/52  Pulse 65  Temp(Src) 98.7 F (37.1 C) (Oral)  Resp 18  SpO2 97% Physical Exam  Nursing note and vitals  reviewed. Constitutional: He is oriented to person, place, and time. He appears well-developed and well-nourished.  HENT:  Head: Normocephalic.  Right Ear: External ear normal.  Left Ear: External ear normal.  Nose: Mucosal edema and rhinorrhea present. Right sinus exhibits maxillary sinus tenderness. Left sinus exhibits maxillary sinus tenderness.  Mouth/Throat: Oropharynx is clear and moist.  Eyes: Conjunctivae are normal. Pupils are equal, round, and reactive to light.  Neck: Normal range of motion. Neck supple.  Cardiovascular: Normal rate, regular rhythm, normal heart sounds and intact distal pulses.   Pulmonary/Chest: Effort normal and breath sounds normal.  Neurological: He is alert and oriented to person, place, and time.  Skin: Skin is warm and dry.    ED Course  Procedures (including critical care time) Labs Review Labs Reviewed - No data to display Imaging Review No results found.  EKG Interpretation     Ventricular Rate:    PR Interval:    QRS Duration:   QT Interval:    QTC Calculation:   R Axis:     Text Interpretation:              MDM      Linna Hoff, MD 04/16/13 1642

## 2013-04-18 LAB — CULTURE, GROUP A STREP

## 2014-04-08 DIAGNOSIS — M25512 Pain in left shoulder: Secondary | ICD-10-CM | POA: Insufficient documentation

## 2014-04-08 DIAGNOSIS — G8929 Other chronic pain: Secondary | ICD-10-CM | POA: Insufficient documentation

## 2015-02-03 DIAGNOSIS — N200 Calculus of kidney: Secondary | ICD-10-CM | POA: Diagnosis present

## 2015-02-03 DIAGNOSIS — I509 Heart failure, unspecified: Secondary | ICD-10-CM | POA: Diagnosis present

## 2015-02-03 DIAGNOSIS — I219 Acute myocardial infarction, unspecified: Secondary | ICD-10-CM | POA: Insufficient documentation

## 2015-02-03 DIAGNOSIS — I2510 Atherosclerotic heart disease of native coronary artery without angina pectoris: Secondary | ICD-10-CM | POA: Insufficient documentation

## 2015-02-10 DIAGNOSIS — M19011 Primary osteoarthritis, right shoulder: Secondary | ICD-10-CM | POA: Insufficient documentation

## 2015-02-10 DIAGNOSIS — M19012 Primary osteoarthritis, left shoulder: Secondary | ICD-10-CM | POA: Insufficient documentation

## 2015-07-08 DIAGNOSIS — E785 Hyperlipidemia, unspecified: Secondary | ICD-10-CM | POA: Insufficient documentation

## 2015-07-08 DIAGNOSIS — Z87442 Personal history of urinary calculi: Secondary | ICD-10-CM | POA: Insufficient documentation

## 2015-07-08 DIAGNOSIS — Z95810 Presence of automatic (implantable) cardiac defibrillator: Secondary | ICD-10-CM | POA: Insufficient documentation

## 2015-07-08 DIAGNOSIS — F419 Anxiety disorder, unspecified: Secondary | ICD-10-CM | POA: Insufficient documentation

## 2015-07-08 DIAGNOSIS — M1990 Unspecified osteoarthritis, unspecified site: Secondary | ICD-10-CM | POA: Insufficient documentation

## 2015-07-08 DIAGNOSIS — F32A Depression, unspecified: Secondary | ICD-10-CM | POA: Insufficient documentation

## 2015-07-08 DIAGNOSIS — G4733 Obstructive sleep apnea (adult) (pediatric): Secondary | ICD-10-CM | POA: Insufficient documentation

## 2015-07-14 DIAGNOSIS — I272 Pulmonary hypertension, unspecified: Secondary | ICD-10-CM | POA: Insufficient documentation

## 2015-07-17 DIAGNOSIS — I5022 Chronic systolic (congestive) heart failure: Secondary | ICD-10-CM | POA: Insufficient documentation

## 2019-11-12 DIAGNOSIS — M899 Disorder of bone, unspecified: Secondary | ICD-10-CM | POA: Insufficient documentation

## 2019-11-12 DIAGNOSIS — Z79899 Other long term (current) drug therapy: Secondary | ICD-10-CM | POA: Insufficient documentation

## 2019-11-12 DIAGNOSIS — Z79891 Long term (current) use of opiate analgesic: Secondary | ICD-10-CM | POA: Insufficient documentation

## 2019-11-12 DIAGNOSIS — Z789 Other specified health status: Secondary | ICD-10-CM | POA: Insufficient documentation

## 2019-11-12 DIAGNOSIS — G894 Chronic pain syndrome: Secondary | ICD-10-CM | POA: Insufficient documentation

## 2019-11-12 NOTE — Progress Notes (Signed)
Patient: [name]  Service Category: E/M  Provider: Gaspar Cola, MD  DOB: April 15, 1946  DOS: 11/13/2019  Referring Provider: [facility]  MRN: [id number]  Setting: Ambulatory outpatient  PCP: Patient, No Pcp Per  Type: New Patient  Specialty: Interventional Pain Management    Location: Office  Delivery: Face-to-face     Primary Reason(s) for Visit: Encounter for initial evaluation of one or more chronic problems (new to examiner) potentially causing chronic pain, and posing a threat to normal musculoskeletal function. (Level of risk: High) CC: Other (bilateral shoulder pain)  HPI  [name] is a 74 y.o. year old, male patient, who comes today to see [place] for the first time for an initial evaluation of his chronic pain. He has Myocardial infarction [facility]); Congestive heart failure ([place]); Coronary artery disease; Kidney stone; Syncope and collapse; Chronic kidney disease, stage III (moderate); Hypothyroidism; Cardiomyopathy; Anxiety; Arthritis; Chronic systolic heart failure ([place]); Depression; History of kidney stones; Hyperlipidemia; Moderate to severe pulmonary hypertension (HCC); OSA (obstructive sleep apnea); Presence of combination internal cardiac defibrillator (ICD) and pacemaker; Primary osteoarthritis, right shoulder; Osteoarthritis of shoulder (Left); Chronic shoulder pain (1ry area of Pain) (Bilateral) (R>L); Coronary atherosclerosis; Chronic pain associated w/ significant psychosocial dysfunction; Pharmacologic therapy; Disorder of skeletal system; Problems influencing health status; Long term prescription opiate use; History of arthroplasty of shoulder (Left); Osteoarthritis of AC (acromioclavicular) joints (Bilateral); Osteoarthritis of shoulders (Bilateral); Chronic shoulder pain after partial shoulder replacement (Left); Arthropathy of shoulder (Left); Biceps tendinopathy (Left); Partial tear of supraspinatus tendon (Left); Glenoid labrum tear, sequela (Left);  Degenerative tear of glenoid labrum (Left); Arthropathy of shoulder (Right); Glenoid labrum tear, right, sequela; Degenerative tear of glenoid labrum of shoulder (Right); Tendinopathy of rotator cuff (Right); Biceps tendinopathy (Right); Supraspinatus tendonitis (Right); and Irritability and anger probably associated with chronic pain on their problem list. Today he comes in for evaluation of his Other (bilateral shoulder pain)  Pain Assessment: Location: Right, Left Shoulder Radiating: back of both arms before elbow Onset: More than a month ago Duration: Chronic pain Quality: Sharp Severity: 7 /10 (subjective, self-reported pain score)  Note: Reported level is compatible with observation.                         When using our objective Pain Scale, levels between 6 and 10/10 are said to belong in an emergency room, as it progressively worsens from a 6/10, described as severely limiting, requiring emergency care not usually available at an outpatient pain management facility. At a 6/10 level, communication becomes difficult and requires great effort. Assistance to reach the emergency department may be required. Facial flushing and profuse sweating along with potentially dangerous increases in heart rate and blood pressure will be evident. Effect on ADL: unable to do things normally Timing: Intermittent Modifying factors: heat BP: 120/64  HR: 80  Onset and Duration: Gradual and Date of onset: 2017 Cause of pain: Unknown Severity: Getting worse, NAS-11 at its worse: 10/10, NAS-11 at its best: 5/10, NAS-11 now: 5/10 and NAS-11 on the average: 5/10 Timing: Not influenced by the time of the day and During activity or exercise Aggravating Factors: Lifiting and Motion Alleviating Factors: Hot packs Associated Problems: Weakness Quality of Pain: Deep and Sharp Previous Examinations or Tests: The patient denies any. Previous Treatments: The patient denies any  The patient comes into the clinics  today for the first time for a chronic pain management evaluation.  According to the patient his primary  area of pain is that of the shoulders.  The pain is bilateral but with the right being worse than the left.  He indicates having the condition for longer than 10 years.  The onset of pain was gradual.  No associated trauma.  He admits to a left partial shoulder replacement done by [facility] in [place].  He is pending to have a CT of the right shoulder next week.  He is now unable to have MRI secondary to a cardiac pacemaker/defibrillator.  However, review of the records show that in 2009 he had a bilateral shoulder MRI which already had demonstrated pathology in both shoulders.  He indicates having a diagnosis of primary osteoarthritis of both shoulders.  He also indicates being sent to the clinics to see if we can offer something to control the pain until he can have his surgery.  They indicate that the surgery is not scheduled yet and they are pending to see the CT scan of the shoulder.  There are no recent x-rays of the shoulder available.  As I was taking the patient's history I noticed through his body language that he was in a rather foul mood.  At this point, I took a "timeout" and directly asked the patient if it was "pasted me"?  To my surprise he answer that "yes" he was placed to me.  I then proceeded to ask him why and he indicated that to start with he was having this pain that he wanted somebody to take away and in addition, I had "kicked his leg".  It is important to note that upon entering the evaluation room, which I may add is rather small, I had to negotiate my way between his wheelchair and where his wife was sitting and apparently I hit his leg, unintentionally and unknowingly.  At this point I apologized for unknowingly and unintentionally having contacted his leg.  However, I can see that there will be some difficulties in establishing a cortical patient physician  relationship.  I do understand and appreciate that chronic pain tends to psychologically affect individuals in different ways, but it is always very difficult and challenging to manage patients with anger issues.  Today I took the time to provide the patient with information regarding my pain practice. The patient was informed that my practice is divided into two sections: an interventional pain management section, as well as a completely separate and distinct medication management section. I explained that I have procedure days for my interventional therapies, and evaluation days for follow-ups and medication management. Because of the amount of documentation required during both, they are kept separated. This means that there is the possibility that he may be scheduled for a procedure on one day, and medication management the next. I have also informed him that because of staffing and facility limitations, I no longer take patients for medication management only. To illustrate the reasons for this, I gave the patient the example of surgeons, and how inappropriate it would be to refer a patient to his/her care, just to write for the post-surgical antibiotics on a surgery done by a different surgeon.   Because interventional pain management is my board-certified specialty, the patient was informed that joining my practice means that they are open to any and all interventional therapies. I made it clear that this does not mean that they will be forced to have any procedures done. What this means is that I believe interventional therapies to be essential part of the  diagnosis and proper management of chronic pain conditions. Therefore, patients not interested in these interventional alternatives will be better served under the care of a different practitioner.  The patient was also made aware of my Comprehensive Pain Management Safety Guidelines where by joining my practice, they limit all of their nerve blocks  and joint injections to those done by our practice, for as long as we are retained to manage their care.   Historic Controlled Substance Pharmacotherapy Review  PMP and historical list of controlled substances: Hydrocodone/APAP 10/325; tramadol 50 mg; hydrocodone/APAP 5/325 Highest opioid analgesic regimen found: Hydrocodone/APAP 10/325 1 tablet p.o. every 8 hours (30 mg/day of hydrocodone) (30 MME/day) Most recent opioid analgesic: Hydrocodone/APAP 10/325 1 tablet p.o. every 8 hours (30 mg/day of hydrocodone) (30 MME/day)  Current opioid analgesics:  Hydrocodone/APAP 10/325 1 tablet p.o. every 8 hours (30 mg/day of hydrocodone) (30 MME/day)  Highest recorded MME/day: 30 mg/day MME/day: 30 mg/day  Medications: The patient did not bring the medication(s) to the appointment, as requested in our "New Patient Package" Pharmacodynamics: Desired effects: Analgesia: The patient reports >50% benefit. Reported improvement in function: The patient reports medication allows him to accomplish basic ADLs. Clinically meaningful improvement in function (CMIF): Sustained CMIF goals met Perceived effectiveness: Described as relatively effective, allowing for increase in activities of daily living (ADL) Undesirable effects: Side-effects or Adverse reactions: None reported Historical Monitoring: The patient  reports no history of drug use. List of all UDS Test(s): No results found. List of other Serum/Urine Drug Screening Test(s):  No results found. Historical Background Evaluation: [place] PMP: PDMP reviewed during this encounter. Six (6) year initial data search conducted.             PMP NARX Score Report:  Narcotic: 441 Sedative: 180 Stimulant: 000  Department of public safety, offender search: Editor, commissioning Information) Non-contributory Risk Assessment Profile: Aberrant behavior: None observed or detected today Risk factors for fatal opioid overdose: None identified today PMP NARX Overdose Risk Score:  230 Fatal overdose hazard ratio (HR): Calculation deferred Non-fatal overdose hazard ratio (HR): Calculation deferred Risk of opioid abuse or dependence: 0.7-3.0% with doses ? 36 MME/day and 6.1-26% with doses ? 120 MME/day. Substance use disorder (SUD) risk level: See below  Pharmacologic Plan: As per protocol, I have not taken over any controlled substance management, pending the results of ordered tests and/or consults.            Initial impression: Pending review of available data and ordered tests.  Meds   Current Outpatient Medications:  .  acetaminophen (TYLENOL) 500 MG tablet, Take 500 mg by mouth every 6 (six) hours as needed. For pain, Disp: , Rfl:  .  aspirin EC 81 MG tablet, Take 81 mg by mouth daily., Disp: , Rfl:  .  carvedilol (COREG) 6.25 MG tablet, Take 3.125 mg by mouth daily., Disp: , Rfl:  .  FLUoxetine (PROZAC) 20 MG capsule, Take 20 mg by mouth daily., Disp: , Rfl:  .  furosemide (LASIX) 20 MG tablet, Take 10 mg by mouth daily., Disp: , Rfl:  .  HYDROcodone-acetaminophen (NORCO) 7.5-325 MG tablet, Take 1 tablet by mouth every 8 (eight) hours as needed for moderate pain., Disp: , Rfl:  .  levothyroxine (SYNTHROID, LEVOTHROID) 50 MCG tablet, Take 50 mcg by mouth daily., Disp: , Rfl:  .  Multiple Vitamins-Minerals (MULTIVITAMINS THER. W/MINERALS) TABS, Take 1 tablet by mouth daily., Disp: , Rfl:  .  nitroGLYCERIN (NITROSTAT) 0.4 MG SL tablet, Place 0.4 mg under  the tongue every 5 (five) minutes as needed. For chest pain, Disp: , Rfl:  .  omega-3 acid ethyl esters (LOVAZA) 1 G capsule, Take 1 g by mouth 2 (two) times daily., Disp: , Rfl:  .  ranitidine (ZANTAC) 300 MG tablet, Take 300 mg by mouth at bedtime., Disp: , Rfl:  .  simvastatin (ZOCOR) 80 MG tablet, Take 40 mg by mouth at bedtime., Disp: , Rfl:  .  Tamsulosin HCl (FLOMAX) 0.4 MG CAPS, Take 0.4 mg by mouth., Disp: , Rfl:   Imaging Review  Cervical Imaging: Cervical DG complete:  Results for orders placed in  visit on 03/31/01  DG Cervical Spine Complete   Narrative FINDINGS CLINICAL DATA:     NECK PAIN STATUS POST INJURY THREE WEEKS AGO. CERVICAL SPINE COMPLETE: PRIOR CERVICAL SPINE RADIOGRAPHS HAVE BEEN SIGNED OUT AND ARE NOT AVAILABLE FOR COMPARISON.  THE PREVERTEBRAL SOFT TISSUES ARE NORMAL.  THERE IS MILD REVERSAL OF THE USUAL CERVICAL LORDOSIS WITHOUT FOCAL ANGULATION OR LISTHESIS.  MILD DISK SPACE LOSS IS NOTED AT C5-6.  THERE IS NO EVIDENCE OF ACUTE FRACTURE OR SUBLUXATION.  OBLIQUE VIEWS DEMONSTRATE NO SIGNIFICANT OSSEOUS FORAMINAL STENOSIS. IMPRESSION NEGATIVE FOR ACUTE FRACTURE, SUBLUXATION, OR STATIC SIGNS OF INSTABILITY.  MILD DEGENERATIVE DISK DISEASE AT C5-6 AND MILD REVERSAL OF THE USUAL CERVICAL LORDOSIS.   Hand Imaging: Hand-R DG Complete:  Results for orders placed during the hospital encounter of 08/31/03  DG Hand Complete Right   Narrative Clinical Data:  Acute injury to right hand with laceration to fifth digit.   RIGHT HAND (THREE VIEWS) 08/31/03  No prior studies for comparison.   There is a nondisplaced fracture through the proximal aspect of the fifth proximal phalanx. Small metallic fragments are seen in the soft tissues near the first distal metacarpal as well as in the wrist presumably related to old injury.   IMPRESSION  Nondisplaced fracture at the base of the fifth proximal phalanx.  Provider: Lilia Pro   Complexity Note: Imaging results reviewed. Results shared with Mr. Harriman, using Layman's terms.                        ROS  Cardiovascular: Heart attack ( Date: 1999), Heart surgery and Pacemaker or defibrillator Pulmonary or Respiratory: No reported pulmonary signs or symptoms such as wheezing and difficulty taking a deep full breath (Asthma), difficulty blowing air out (Emphysema), coughing up mucus (Bronchitis), persistent dry cough, or temporary stoppage of breathing during sleep Neurological: No reported neurological signs or symptoms  such as seizures, abnormal skin sensations, urinary and/or fecal incontinence, being born with an abnormal open spine and/or a tethered spinal cord Psychological-Psychiatric: No reported psychological or psychiatric signs or symptoms such as difficulty sleeping, anxiety, depression, delusions or hallucinations (schizophrenial), mood swings (bipolar disorders) or suicidal ideations or attempts Gastrointestinal: No reported gastrointestinal signs or symptoms such as vomiting or evacuating blood, reflux, heartburn, alternating episodes of diarrhea and constipation, inflamed or scarred liver, or pancreas or irrregular and/or infrequent bowel movements Genitourinary: Kidney disease Hematological: No reported hematological signs or symptoms such as prolonged bleeding, low or poor functioning platelets, bruising or bleeding easily, hereditary bleeding problems, low energy levels due to low hemoglobin or being anemic Endocrine: No reported endocrine signs or symptoms such as high or low blood sugar, rapid heart rate due to high thyroid levels, obesity or weight gain due to slow thyroid or thyroid disease Rheumatologic: Joint aches and or swelling due to excess weight (Osteoarthritis) Musculoskeletal:  Negative for myasthenia gravis, muscular dystrophy, multiple sclerosis or malignant hyperthermia Work History: Retired  Allergies  Mr. Quach is allergic to sulfa antibiotics.  Laboratory Chemistry Profile   Renal Lab Results  Component Value Date   BUN 22 11/13/2019   CREATININE 1.70 (H) 11/13/2019   GFRAA 46 (L) 11/13/2019   GFRNONAA 39 (L) 11/13/2019     Electrolytes Lab Results  Component Value Date   NA 136 11/13/2019   K 4.3 11/13/2019   CL 102 11/13/2019   CALCIUM 9.5 11/13/2019   MG 1.6 (L) 11/13/2019     Hepatic No results found.   ID Lab Results  Component Value Date   STAPHAUREUS  11/24/2009    NEGATIVE        The Xpert SA Assay (FDA approved for NASAL specimens only),  is one component of a comprehensive surveillance program.  It is not intended to diagnose infection nor to guide or monitor treatment.   MRSAPCR NEGATIVE 11/24/2009     Bone No results found.   Endocrine Lab Results  Component Value Date   GLUCOSE 103 (H) 11/13/2019   TSH 0.413 07/10/2011     Neuropathy No results found.   CNS No results found.   Inflammation (CRP: Acute  ESR: Chronic) No results found.   Rheumatology No results found.   Coagulation Lab Results  Component Value Date   PLT 208 07/11/2011     Cardiovascular Lab Results  Component Value Date   CKTOTAL 70 07/11/2011   CKMB 2.2 07/11/2011   TROPONINI <0.30 07/11/2011   HGB 13.9 07/11/2011   HCT 39.6 07/11/2011     Screening Lab Results  Component Value Date   STAPHAUREUS  11/24/2009    NEGATIVE        The Xpert SA Assay (FDA approved for NASAL specimens only), is one component of a comprehensive surveillance program.  It is not intended to diagnose infection nor to guide or monitor treatment.   MRSAPCR NEGATIVE 11/24/2009     Cancer No results found.   Allergens No results found.     Note: Lab results reviewed.  PFSH  Drug: Mr. Minniefield  reports no history of drug use. Alcohol:  reports no history of alcohol use. Tobacco:  reports that he has never smoked. He has never used smokeless tobacco. Medical:  has a past medical history of CAD (coronary artery disease), native coronary artery, CHF (congestive heart failure) ([place]), CKD (chronic kidney disease) stage 3, GFR 30-59 ml/min, Hyperlipidemia, Hypertension, Hypothyroidism, Myocardial infarction ([place]), and Nephrolithiasis. Family: family history includes Coronary artery disease in his father.  Past Surgical History:  Procedure Laterality Date  . CORONARY ARTERY BYPASS GRAFT  04/1999   LIMA-LAD, SVG-D2, SVG-OM2  . NEPHROLITHOTOMY  06/2005 and 11/2009   Left  . ORIF FINGER FRACTURE  08/2003   Irrigation and debridement, open  reduction and internal fixation of open fracture to proximal phalynx , right hand, 5th digit  . RHINOPLASTY    . SHOULDER SURGERY     Active Ambulatory Problems    Diagnosis Date Noted  . Myocardial infarction ([place])   . Congestive heart failure ([place]) 02/03/2015  . Coronary artery disease   . Kidney stone 02/03/2015  . Syncope and collapse 07/10/2011  . Chronic kidney disease, stage III (moderate) 07/10/2011  . Hypothyroidism 07/10/2011  . Cardiomyopathy 07/12/2011  . Anxiety 07/08/2015  . Arthritis 07/08/2015  . Chronic systolic heart failure ([place]) 07/17/2015  . Depression 07/08/2015  . History of kidney stones  07/08/2015  . Hyperlipidemia 07/08/2015  . Moderate to severe pulmonary hypertension ([place]) 07/14/2015  . OSA (obstructive sleep apnea) 07/08/2015  . Presence of combination internal cardiac defibrillator (ICD) and pacemaker 07/08/2015  . Primary osteoarthritis, right shoulder 02/10/2015  . Osteoarthritis of shoulder (Left) 02/10/2015  . Chronic shoulder pain (1ry area of Pain) (Bilateral) (R>L) 04/08/2014  . Coronary atherosclerosis 02/03/2015  . Chronic pain associated w/ significant psychosocial dysfunction 11/12/2019  . Pharmacologic therapy 11/12/2019  . Disorder of skeletal system 11/12/2019  . Problems influencing health status 11/12/2019  . Long term prescription opiate use 11/12/2019  . History of arthroplasty of shoulder (Left) 11/13/2019  . Osteoarthritis of AC (acromioclavicular) joints (Bilateral) 11/13/2019  . Osteoarthritis of shoulders (Bilateral) 11/13/2019  . Chronic shoulder pain after partial shoulder replacement (Left) 11/13/2019  . Arthropathy of shoulder (Left) 11/14/2019  . Biceps tendinopathy (Left) 11/14/2019  . Partial tear of supraspinatus tendon (Left) 11/14/2019  . Glenoid labrum tear, sequela (Left) 11/14/2019  . Degenerative tear of glenoid labrum (Left) 11/14/2019  . Arthropathy of shoulder (Right) 11/14/2019  . Glenoid labrum tear,  right, sequela 11/14/2019  . Degenerative tear of glenoid labrum of shoulder (Right) 11/14/2019  . Tendinopathy of rotator cuff (Right) 11/14/2019  . Biceps tendinopathy (Right) 11/14/2019  . Supraspinatus tendonitis (Right) 11/14/2019  . Irritability and anger probably associated with chronic pain 11/14/2019   Resolved Ambulatory Problems    Diagnosis Date Noted  . Acute myocardial infarction [facility]) (02/03/2015) 02/03/2015   Past Medical History:  Diagnosis Date  . CAD (coronary artery disease), native coronary artery   . CHF (congestive heart failure) ([place])   . CKD (chronic kidney disease) stage 3, GFR 30-59 ml/min   . Hypertension   . Nephrolithiasis    Constitutional Exam  General appearance: Well nourished, well developed, and well hydrated. In no apparent acute distress Vitals:   11/13/19 1148  BP: 120/64  Pulse: 80  Resp: 18  Temp: (!) 97.3 F (36.3 C)  TempSrc: Temporal  SpO2: 97%  Weight: 270 lb (122.5 kg)  Height: 5' 9"  (1.753 m)   BMI Assessment: Estimated body mass index is 39.87 kg/m as calculated from the following:   Height as of this encounter: 5' 9"  (1.753 m).   Weight as of this encounter: 270 lb (122.5 kg).  BMI interpretation table: BMI level Category Range association with higher incidence of chronic pain  <18 kg/m2 Underweight   18.5-24.9 kg/m2 Ideal body weight   25-29.9 kg/m2 Overweight Increased incidence by 20%  30-34.9 kg/m2 Obese (Class I) Increased incidence by 68%  35-39.9 kg/m2 Severe obesity (Class II) Increased incidence by 136%  >40 kg/m2 Extreme obesity (Class III) Increased incidence by 254%   Patient's current BMI Ideal Body weight  Body mass index is 39.87 kg/m. Ideal body weight: 70.7 kg (155 lb 13.8 oz) Adjusted ideal body weight: 91.4 kg (201 lb 8.3 oz)   BMI Readings from Last 4 Encounters:  11/13/19 39.87 kg/m  07/12/11 37.38 kg/m   Wt Readings from Last 4 Encounters:  11/13/19 270 lb (122.5 kg)  07/12/11 260 lb 8 oz  (118.2 kg)   Psych/Mental status: Alert, oriented x 3 (person, place, & time) Irritable and angry Eyes: PERLA Respiratory: No evidence of acute respiratory distress  Cervical Spine Exam  Skin & Axial Inspection: No masses, redness, edema, swelling, or associated skin lesions Alignment: Symmetrical Functional ROM: Unrestricted ROM      Stability: No instability detected Muscle Tone/Strength: Functionally intact. No obvious neuro-muscular anomalies  detected. Sensory (Neurological): Unimpaired Palpation: No palpable anomalies              Upper Extremity (UE) Exam    Side: Right upper extremity  Side: Left upper extremity  Skin & Extremity Inspection: Skin color, temperature, and hair growth are WNL. No peripheral edema or cyanosis. No masses, redness, swelling, asymmetry, or associated skin lesions. No contractures.  Skin & Extremity Inspection: Evidence of prior arthroplastic surgery  Functional ROM: Minimal ROM for shoulder  Functional ROM: Decreased ROM for shoulder  Muscle Tone/Strength: Guarding  Muscle Tone/Strength: TEFL teacher (Neurological): Movement-associated pain affecting the shoulder  Sensory (Neurological): Movement-associated pain affecting the shoulder  Palpation: Complains of area being tender to palpation              Palpation: Complains of area being tender to palpation              Provocative Test(s):  Phalen's test: deferred Tinel's test: deferred Apley's scratch test (touch opposite shoulder):  Action 1 (Across chest): Decreased ROM Action 2 (Overhead): Unable to perform Action 3 (LB reach): Unable to perform   Provocative Test(s):  Phalen's test: deferred Tinel's test: deferred Apley's scratch test (touch opposite shoulder):  Action 1 (Across chest): Decreased ROM Action 2 (Overhead): Decreased ROM Action 3 (LB reach): Unable to perform    Gait & Posture Assessment  Ambulation: Patient brought in today on a stretcher Posture: Tense   Assessment   Primary Diagnosis & Pertinent Problem List: The primary encounter diagnosis was Chronic pain associated w/ significant psychosocial dysfunction. Diagnoses of Chronic shoulder pain (1ry area of Pain) (Bilateral) (R>L), Primary osteoarthritis, left shoulder, Primary osteoarthritis, right shoulder, History of arthroplasty of shoulder (Left), Osteoarthritis of AC (acromioclavicular) joints (Bilateral), Osteoarthritis of shoulders (Bilateral), Chronic shoulder pain after partial shoulder replacement (Left), Arthropathy of shoulder (Left), Biceps tendinopathy (Left), Partial tear of supraspinatus tendon (Left), Glenoid labrum tear, sequela (Left), Degenerative tear of glenoid labrum (Left), Arthropathy of shoulder (Right), Glenoid labrum tear, right, sequela, Degenerative tear of glenoid labrum of shoulder (Right), Tendinopathy of rotator cuff (Right), Biceps tendinopathy (Right), Supraspinatus tendonitis (Right), Irritability and anger probably associated with chronic pain, Pharmacologic therapy, Disorder of skeletal system, Problems influencing health status, and Long term prescription opiate use were also pertinent to this visit.  Visit Diagnosis (New problems to examiner): 1. Chronic pain associated w/ significant psychosocial dysfunction   2. Chronic shoulder pain (1ry area of Pain) (Bilateral) (R>L)   3. Primary osteoarthritis, left shoulder   4. Primary osteoarthritis, right shoulder   5. History of arthroplasty of shoulder (Left)   6. Osteoarthritis of AC (acromioclavicular) joints (Bilateral)   7. Osteoarthritis of shoulders (Bilateral)   8. 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 Arthropathy of shoulder (Left)   10. Biceps tendinopathy (Left)   11. Partial tear of supraspinatus tendon (Left)   12. Glenoid labrum tear, sequela (Left)   13. Degenerative tear of glenoid labrum (Left)   14. Arthropathy of shoulder (Right)   15. Glenoid labrum tear, right, sequela    16. Degenerative tear of glenoid labrum of shoulder (Right)   17. Tendinopathy of rotator cuff (Right)   18. Biceps tendinopathy (Right)   19. Supraspinatus tendonitis (Right)   20. Irritability and anger probably associated with chronic pain   21. Pharmacologic therapy   22. Disorder of skeletal system   23. Problems influencing health status   24. Long term prescription opiate use    Plan of  Care (Initial workup plan)  Note: Mr. Olenik was reminded that as per protocol, today's visit has been an evaluation only. We have not taken over the patient's controlled substance management.  Problem-specific plan: No problem-specific Assessment & Plan notes found for this encounter.   Lab Orders     Compliance Drug Analysis, Ur     Comp. Metabolic Panel (12)     Magnesium     Vitamin B12     Sedimentation rate     25-Hydroxy vitamin D Lcms D2+D3     C-reactive protein  Imaging Orders     DG Shoulder Right     DG Shoulder Left Referral Orders  No referral(s) requested today   Procedure Orders    No procedure(s) ordered today   Pharmacotherapy (current): Medications ordered:  No orders of the defined types were placed in this encounter.  Medications administered during this visit: Torsten C. Student had no medications administered during this visit.   Pharmacological management options:  Analgesics: Based on today's encounter, it is my honest impression that our patient physician relationship does not have the necessary chemistry for me to become his permanent chronic pain management physician.  Since he is in need of some immediate assistance, I will go ahead and provide him with that, but I will not be taking over his medication management.  I will be more than happy to provide recommendations, as soon as we can get his pain under better control with the interventional therapies.  For now, my recommendation is to continue with his medications, as is.   Interventional  management options: Mr. Yankowski was informed that there is no guarantee that he would be a candidate for interventional therapies. The decision will be based on the results of diagnostic studies, as well as Mr. Perkin's risk profile.  Procedure(s) under consideration:  Therapeutic right-sided intra-articular glenohumeral, acromioclavicular, and biceps tendon injection #1 + diagnostic left-sided suprascapular nerve block #1    Provider-requested follow-up: Return for Procedure (w/ sedation): (R) IA Shoulder inj. + (L) SSNB.  Future Appointments  Date Time Provider [place]  11/14/2019  7:45 AM Milinda Pointer, MD [facility] None    Note by: Gaspar Cola, MD Date: 11/13/2019; Time: 6:08 AM

## 2019-11-13 ENCOUNTER — Ambulatory Visit
Admission: RE | Admit: 2019-11-13 | Discharge: 2019-11-13 | Disposition: A | Discharge status: Home / Self Care | Payer: No Typology Code available for payment source | Source: Ambulatory Visit | Attending: Pain Medicine | Admitting: Pain Medicine

## 2019-11-13 ENCOUNTER — Encounter: Payer: Self-pay | Admitting: Pain Medicine

## 2019-11-13 ENCOUNTER — Other Ambulatory Visit
Admission: RE | Admit: 2019-11-13 | Discharge: 2019-11-13 | Disposition: A | Discharge status: Home / Self Care | Payer: No Typology Code available for payment source | Source: Ambulatory Visit | Attending: Pain Medicine | Admitting: Pain Medicine

## 2019-11-13 ENCOUNTER — Other Ambulatory Visit: Payer: Self-pay

## 2019-11-13 ENCOUNTER — Ambulatory Visit (HOSPITAL_BASED_OUTPATIENT_CLINIC_OR_DEPARTMENT_OTHER): Payer: No Typology Code available for payment source | Admitting: Pain Medicine

## 2019-11-13 VITALS — BP 120/64 | HR 80 | Temp 97.3°F | Resp 18 | Ht 69.0 in | Wt 270.0 lb

## 2019-11-13 DIAGNOSIS — Z789 Other specified health status: Secondary | ICD-10-CM | POA: Insufficient documentation

## 2019-11-13 DIAGNOSIS — M899 Disorder of bone, unspecified: Secondary | ICD-10-CM | POA: Insufficient documentation

## 2019-11-13 DIAGNOSIS — G8929 Other chronic pain: Secondary | ICD-10-CM | POA: Insufficient documentation

## 2019-11-13 DIAGNOSIS — M19011 Primary osteoarthritis, right shoulder: Secondary | ICD-10-CM | POA: Insufficient documentation

## 2019-11-13 DIAGNOSIS — M25512 Pain in left shoulder: Secondary | ICD-10-CM

## 2019-11-13 DIAGNOSIS — M75102 Unspecified rotator cuff tear or rupture of left shoulder, not specified as traumatic: Secondary | ICD-10-CM | POA: Insufficient documentation

## 2019-11-13 DIAGNOSIS — M24111 Other articular cartilage disorders, right shoulder: Secondary | ICD-10-CM

## 2019-11-13 DIAGNOSIS — G894 Chronic pain syndrome: Secondary | ICD-10-CM | POA: Insufficient documentation

## 2019-11-13 DIAGNOSIS — S43431S Superior glenoid labrum lesion of right shoulder, sequela: Secondary | ICD-10-CM

## 2019-11-13 DIAGNOSIS — S43432S Superior glenoid labrum lesion of left shoulder, sequela: Secondary | ICD-10-CM

## 2019-11-13 DIAGNOSIS — M25519 Pain in unspecified shoulder: Secondary | ICD-10-CM | POA: Insufficient documentation

## 2019-11-13 DIAGNOSIS — M67922 Unspecified disorder of synovium and tendon, left upper arm: Secondary | ICD-10-CM

## 2019-11-13 DIAGNOSIS — Z79899 Other long term (current) drug therapy: Secondary | ICD-10-CM | POA: Insufficient documentation

## 2019-11-13 DIAGNOSIS — Z96619 Presence of unspecified artificial shoulder joint: Secondary | ICD-10-CM | POA: Insufficient documentation

## 2019-11-13 DIAGNOSIS — R454 Irritability and anger: Secondary | ICD-10-CM | POA: Insufficient documentation

## 2019-11-13 DIAGNOSIS — M7591 Shoulder lesion, unspecified, right shoulder: Secondary | ICD-10-CM | POA: Insufficient documentation

## 2019-11-13 DIAGNOSIS — Z96612 Presence of left artificial shoulder joint: Secondary | ICD-10-CM

## 2019-11-13 DIAGNOSIS — M67921 Unspecified disorder of synovium and tendon, right upper arm: Secondary | ICD-10-CM

## 2019-11-13 DIAGNOSIS — M19012 Primary osteoarthritis, left shoulder: Secondary | ICD-10-CM | POA: Insufficient documentation

## 2019-11-13 DIAGNOSIS — Z79891 Long term (current) use of opiate analgesic: Secondary | ICD-10-CM | POA: Insufficient documentation

## 2019-11-13 DIAGNOSIS — M24112 Other articular cartilage disorders, left shoulder: Secondary | ICD-10-CM | POA: Insufficient documentation

## 2019-11-13 DIAGNOSIS — M25511 Pain in right shoulder: Secondary | ICD-10-CM | POA: Insufficient documentation

## 2019-11-13 DIAGNOSIS — M67911 Unspecified disorder of synovium and tendon, right shoulder: Secondary | ICD-10-CM

## 2019-11-13 LAB — COMPREHENSIVE METABOLIC PANEL
ALT: 12 U/L (ref 0–44)
AST: 16 U/L (ref 15–41)
Albumin: 3.9 g/dL (ref 3.5–5.0)
Alkaline Phosphatase: 87 U/L (ref 38–126)
Anion gap: 12 (ref 5–15)
BUN: 22 mg/dL (ref 8–23)
CO2: 22 mmol/L (ref 22–32)
Calcium: 9.5 mg/dL (ref 8.9–10.3)
Chloride: 102 mmol/L (ref 98–111)
Creatinine, Ser: 1.7 mg/dL — ABNORMAL HIGH (ref 0.61–1.24)
GFR calc Af Amer: 46 mL/min — ABNORMAL LOW (ref >60)
GFR calc non Af Amer: 39 mL/min — ABNORMAL LOW (ref >60)
Glucose, Bld: 103 mg/dL — ABNORMAL HIGH (ref 70–99)
Potassium: 4.3 mmol/L (ref 3.5–5.1)
Sodium: 136 mmol/L (ref 135–145)
Total Bilirubin: 1.6 mg/dL — ABNORMAL HIGH (ref 0.3–1.2)
Total Protein: 8.8 g/dL — ABNORMAL HIGH (ref 6.5–8.1)

## 2019-11-13 LAB — C-REACTIVE PROTEIN: CRP: 4.3 mg/dL — ABNORMAL HIGH (ref <1.0)

## 2019-11-13 LAB — SEDIMENTATION RATE: Sed Rate: 63 mm/hr — ABNORMAL HIGH (ref 0–20)

## 2019-11-13 LAB — MAGNESIUM: Magnesium: 1.6 mg/dL — ABNORMAL LOW (ref 1.7–2.4)

## 2019-11-13 LAB — VITAMIN B12: Vitamin B-12: 347 pg/mL (ref 180–914)

## 2019-11-13 LAB — VITAMIN D 25 HYDROXY (VIT D DEFICIENCY, FRACTURES): Vit D, 25-Hydroxy: 23 ng/mL — ABNORMAL LOW (ref 30–100)

## 2019-11-13 NOTE — Patient Instructions (Addendum)
____________________________________________________________________________________________  General Risks and Possible Complications  Patient Responsibilities: It is important that you read this as it is part of your informed consent. It is our duty to inform you of the risks and possible complications associated with treatments offered to you. It is your responsibility as a patient to read this and to ask questions about anything that is not clear or that you believe was not covered in this document.  Patient's Rights: You have the right to refuse treatment. You also have the right to change your mind, even after initially having agreed to have the treatment done. However, under this last option, if you wait until the last second to change your mind, you may be charged for the materials used up to that point.  Introduction: Medicine is not an exact science. Everything in Medicine, including the lack of treatment(s), carries the potential for danger, harm, or loss (which is by definition: Risk). In Medicine, a complication is a secondary problem, condition, or disease that can aggravate an already existing one. All treatments carry the risk of possible complications. The fact that a side effects or complications occurs, does not imply that the treatment was conducted incorrectly. It must be clearly understood that these can happen even when everything is done following the highest safety standards.  No treatment: You can choose not to proceed with the proposed treatment alternative. The "PRO(s)" would include: avoiding the risk of complications associated with the therapy. The "CON(s)" would include: not getting any of the treatment benefits. These benefits fall under one of three categories: diagnostic; therapeutic; and/or palliative. Diagnostic benefits include: getting information which can ultimately lead to improvement of the disease or symptom(s). Therapeutic benefits are those associated with the  successful treatment of the disease. Finally, palliative benefits are those related to the decrease of the primary symptoms, without necessarily curing the condition (example: decreasing the pain from a flare-up of a chronic condition, such as incurable terminal cancer).  General Risks and Complications: These are associated to most interventional treatments. They can occur alone, or in combination. They fall under one of the following six (6) categories: no benefit or worsening of symptoms; bleeding; infection; nerve damage; allergic reactions; and/or death. 1. No benefits or worsening of symptoms: In Medicine there are no guarantees, only probabilities. No healthcare provider can ever guarantee that a medical treatment will work, they can only state the probability that it may. Furthermore, there is always the possibility that the condition may worsen, either directly, or indirectly, as a consequence of the treatment. 2. Bleeding: This is more common if the patient is taking a blood thinner, either prescription or over the counter (example: Goody Powders, Fish oil, Aspirin, Garlic, etc.), or if suffering a condition associated with impaired coagulation (example: Hemophilia, cirrhosis of the liver, low platelet counts, etc.). However, even if you do not have one on these, it can still happen. If you have any of these conditions, or take one of these drugs, make sure to notify your treating physician. 3. Infection: This is more common in patients with a compromised immune system, either due to disease (example: diabetes, cancer, human immunodeficiency virus [HIV], etc.), or due to medications or treatments (example: therapies used to treat cancer and rheumatological diseases). However, even if you do not have one on these, it can still happen. If you have any of these conditions, or take one of these drugs, make sure to notify your treating physician. 4. Nerve Damage: This is more common when the   treatment is  an invasive one, but it can also happen with the use of medications, such as those used in the treatment of cancer. The damage can occur to small secondary nerves, or to large primary ones, such as those in the spinal cord and brain. This damage may be temporary or permanent and it may lead to impairments that can range from temporary numbness to permanent paralysis and/or brain death. 5. Allergic Reactions: Any time a substance or material comes in contact with our body, there is the possibility of an allergic reaction. These can range from a mild skin rash (contact dermatitis) to a severe systemic reaction (anaphylactic reaction), which can result in death. 6. Death: In general, any medical intervention can result in death, most of the time due to an unforeseen complication. ____________________________________________________________________________________________  ____________________________________________________________________________________________  Preparing for Procedure with Sedation  Procedure appointments are limited to planned procedures: . No Prescription Refills. . No disability issues will be discussed. . No medication changes will be discussed.  Instructions: . Oral Intake: Do not eat or drink anything for at least 8 hours prior to your procedure. (Exception: Blood Pressure Medication. See below.) . Transportation: Unless otherwise stated by your physician, you may drive yourself after the procedure. . Blood Pressure Medicine: Do not forget to take your blood pressure medicine with a sip of water the morning of the procedure. If your Diastolic (lower reading)is above 100 mmHg, elective cases will be cancelled/rescheduled. . Blood thinners: These will need to be stopped for procedures. Notify our staff if you are taking any blood thinners. Depending on which one you take, there will be specific instructions on how and when to stop it. . Diabetics on insulin: Notify the staff  so that you can be scheduled 1st case in the morning. If your diabetes requires high dose insulin, take only  of your normal insulin dose the morning of the procedure and notify the staff that you have done so. . Preventing infections: Shower with an antibacterial soap the morning of your procedure. . Build-up your immune system: Take 1000 mg of Vitamin C with every meal (3 times a day) the day prior to your procedure. . Antibiotics: Inform the staff if you have a condition or reason that requires you to take antibiotics before dental procedures. . Pregnancy: If you are pregnant, call and cancel the procedure. . Sickness: If you have a cold, fever, or any active infections, call and cancel the procedure. . Arrival: You must be in the facility at least 30 minutes prior to your scheduled procedure. . Children: Do not bring children with you. . Dress appropriately: Bring dark clothing that you would not mind if they get stained. . Valuables: Do not bring any jewelry or valuables.  Reasons to call and reschedule or cancel your procedure: (Following these recommendations will minimize the risk of a serious complication.) . Surgeries: Avoid having procedures within 2 weeks of any surgery. (Avoid for 2 weeks before or after any surgery). . Flu Shots: Avoid having procedures within 2 weeks of a flu shots or . (Avoid for 2 weeks before or after immunizations). . Barium: Avoid having a procedure within 7-10 days after having had a radiological study involving the use of radiological contrast. (Myelograms, Barium swallow or enema study). . Heart attacks: Avoid any elective procedures or surgeries for the initial 6 months after a "Myocardial Infarction" (Heart Attack). . Blood thinners: It is imperative that you stop these medications before procedures. Let us know if you   if you take any blood thinner.  . Infection: Avoid procedures during or within two weeks of an infection (including chest colds or  gastrointestinal problems). Symptoms associated with infections include: Localized redness, fever, chills, night sweats or profuse sweating, burning sensation when voiding, cough, congestion, stuffiness, runny nose, sore throat, diarrhea, nausea, vomiting, cold or Flu symptoms, recent or current infections. It is specially important if the infection is over the area that we intend to treat. Marland Kitchen Heart and lung problems: Symptoms that may suggest an active cardiopulmonary problem include: cough, chest pain, breathing difficulties or shortness of breath, dizziness, ankle swelling, uncontrolled high or unusually low blood pressure, and/or palpitations. If you are experiencing any of these symptoms, cancel your procedure and contact your primary care physician for an evaluation.  Remember:  Regular Business hours are:  Monday to Thursday 8:00 AM to 4:00 PM  Provider's Schedule: Delano Metz, MD:  Procedure days: Tuesday and Thursday 7:30 AM to 4:00 PM  Edward Jolly, MD:  Procedure days: Monday and Wednesday 7:30 AM to 4:00 PM ____________________________________________________________________________________________   ____________________________________________________________________________________________  Preparing for Procedure with Sedation  Procedure appointments are limited to planned procedures: . No Prescription Refills. . No disability issues will be discussed. . No medication changes will be discussed.  Instructions: . Oral Intake: Do not eat or drink anything for at least 8 hours prior to your procedure. (Exception: Blood Pressure Medication. See below.) . Transportation: Unless otherwise stated by your physician, you may drive yourself after the procedure. . Blood Pressure Medicine: Do not forget to take your blood pressure medicine with a sip of water the morning of the procedure. If your Diastolic (lower reading)is above 100 mmHg, elective cases will be  cancelled/rescheduled. . Blood thinners: These will need to be stopped for procedures. Notify our staff if you are taking any blood thinners. Depending on which one you take, there will be specific instructions on how and when to stop it. . Diabetics on insulin: Notify the staff so that you can be scheduled 1st case in the morning. If your diabetes requires high dose insulin, take only  of your normal insulin dose the morning of the procedure and notify the staff that you have done so. . Preventing infections: Shower with an antibacterial soap the morning of your procedure. . Build-up your immune system: Take 1000 mg of Vitamin C with every meal (3 times a day) the day prior to your procedure. Marland Kitchen Antibiotics: Inform the staff if you have a condition or reason that requires you to take antibiotics before dental procedures. . Pregnancy: If you are pregnant, call and cancel the procedure. . Sickness: If you have a cold, fever, or any active infections, call and cancel the procedure. . Arrival: You must be in the facility at least 30 minutes prior to your scheduled procedure. . Children: Do not bring children with you. . Dress appropriately: Bring dark clothing that you would not mind if they get stained. . Valuables: Do not bring any jewelry or valuables.  Reasons to call and reschedule or cancel your procedure: (Following these recommendations will minimize the risk of a serious complication.) . Surgeries: Avoid having procedures within 2 weeks of any surgery. (Avoid for 2 weeks before or after any surgery). . Flu Shots: Avoid having procedures within 2 weeks of a flu shots or . (Avoid for 2 weeks before or after immunizations). . Barium: Avoid having a procedure within 7-10 days after having had a radiological study involving the use of radiological  contrast. (Myelograms, Barium swallow or enema study). . Heart attacks: Avoid any elective procedures or surgeries for the initial 6 months after a  "Myocardial Infarction" (Heart Attack). . Blood thinners: It is imperative that you stop these medications before procedures. Let us know if you if you take any blood thinner.  . Infection: Avoid procedures during or within two weeks of an infection (including chest colds or gastrointestinal problems). Symptoms associated with infections include: Localized redness, fever, chills, night sweats or profuse sweating, burning sensation when voiding, cough, congestion, stuffiness, runny nose, sore throat, diarrhea, nausea, vomiting, cold or Flu symptoms, recent or current infections. It is specially important if the infection is over the area that we intend to treat. Marland Kitchen Heart and lung problems: Symptoms that may suggest an active cardiopulmonary problem include: cough, chest pain, breathing difficulties or shortness of breath, dizziness, ankle swelling, uncontrolled high or unusually low blood pressure, and/or palpitations. If you are experiencing any of these symptoms, cancel your procedure and contact your primary care physician for an evaluation.  Remember:  Regular Business hours are:  Monday to Thursday 8:00 AM to 4:00 PM  Provider's Schedule: Delano Metz, MD:  Procedure days: Tuesday and Thursday 7:30 AM to 4:00 PM  Edward Jolly, MD:  Procedure days: Monday and Wednesday 7:30 AM to 4:00 PM ____________________________________________________________________________________________  ____________________________________________________________________________________________  Preparing for Procedure with Sedation  Procedure appointments are limited to planned procedures: . No Prescription Refills. . No disability issues will be discussed. . No medication changes will be discussed.  Instructions: . Oral Intake: Do not eat or drink anything for at least 8 hours prior to your procedure. (Exception: Blood Pressure Medication. See below.) . Transportation: Unless otherwise stated by your  physician, you may drive yourself after the procedure. . Blood Pressure Medicine: Do not forget to take your blood pressure medicine with a sip of water the morning of the procedure. If your Diastolic (lower reading)is above 100 mmHg, elective cases will be cancelled/rescheduled. . Blood thinners: These will need to be stopped for procedures. Notify our staff if you are taking any blood thinners. Depending on which one you take, there will be specific instructions on how and when to stop it. . Diabetics on insulin: Notify the staff so that you can be scheduled 1st case in the morning. If your diabetes requires high dose insulin, take only  of your normal insulin dose the morning of the procedure and notify the staff that you have done so. . Preventing infections: Shower with an antibacterial soap the morning of your procedure. . Build-up your immune system: Take 1000 mg of Vitamin C with every meal (3 times a day) the day prior to your procedure. Marland Kitchen Antibiotics: Inform the staff if you have a condition or reason that requires you to take antibiotics before dental procedures. . Pregnancy: If you are pregnant, call and cancel the procedure. . Sickness: If you have a cold, fever, or any active infections, call and cancel the procedure. . Arrival: You must be in the facility at least 30 minutes prior to your scheduled procedure. . Children: Do not bring children with you. . Dress appropriately: Bring dark clothing that you would not mind if they get stained. . Valuables: Do not bring any jewelry or valuables.  Reasons to call and reschedule or cancel your procedure: (Following these recommendations will minimize the risk of a serious complication.) . Surgeries: Avoid having procedures within 2 weeks of any surgery. (Avoid for 2 weeks before or after any  surgery). . Flu Shots: Avoid having procedures within 2 weeks of a flu shots or . (Avoid for 2 weeks before or after immunizations). . Barium: Avoid  having a procedure within 7-10 days after having had a radiological study involving the use of radiological contrast. (Myelograms, Barium swallow or enema study). . Heart attacks: Avoid any elective procedures or surgeries for the initial 6 months after a "Myocardial Infarction" (Heart Attack). . Blood thinners: It is imperative that you stop these medications before procedures. Let us know if you if you take any blood thinner.  . Infection: Avoid procedures during or within two weeks of an infection (including chest colds or gastrointestinal problems). Symptoms associated with infections include: Localized redness, fever, chills, night sweats or profuse sweating, burning sensation when voiding, cough, congestion, stuffiness, runny nose, sore throat, diarrhea, nausea, vomiting, cold or Flu symptoms, recent or current infections. It is specially important if the infection is over the area that we intend to treat. Marland Kitchen Heart and lung problems: Symptoms that may suggest an active cardiopulmonary problem include: cough, chest pain, breathing difficulties or shortness of breath, dizziness, ankle swelling, uncontrolled high or unusually low blood pressure, and/or palpitations. If you are experiencing any of these symptoms, cancel your procedure and contact your primary care physician for an evaluation.  Remember:  Regular Business hours are:  Monday to Thursday 8:00 AM to 4:00 PM  Provider's Schedule: Milinda Pointer, MD:  Procedure days: Tuesday and Thursday 7:30 AM to 4:00 PM  Gillis Santa, MD:  Procedure days: Monday and Wednesday 7:30 AM to 4:00 PM ____________________________________________________________________________________________  Pain Management Discharge Instructions  General Discharge Instructions :  If you need to reach your doctor call: Monday-Friday 8:00 am - 4:00 pm at [phone] or toll free [phone].  After clinic hours [phone] to have operator reach  doctor.  Bring all of your medication bottles to all your appointments in the pain clinic.  To cancel or reschedule your appointment with Pain Management please remember to call 24 hours in advance to avoid a fee.  Refer to the educational materials which you have been given on: General Risks, I had my Procedure. Discharge Instructions, Post Sedation.  Post Procedure Instructions:  The drugs you were given will stay in your system until tomorrow, so for the next 24 hours you should not drive, make any legal decisions or drink any alcoholic beverages.  You may eat anything you prefer, but it is better to start with liquids then soups and crackers, and gradually work up to solid foods.  Please notify your doctor immediately if you have any unusual bleeding, trouble breathing or pain that is not related to your normal pain.  Depending on the type of procedure that was done, some parts of your body may feel week and/or numb.  This usually clears up by tonight or the next day.  Walk with the use of an assistive device or accompanied by an adult for the 24 hours.  You may use ice on the affected area for the first 24 hours.  Put ice in a Ziploc bag and cover with a towel and place against area 15 minutes on 15 minutes off.  You may switch to heat after 24 hours.

## 2019-11-13 NOTE — Progress Notes (Signed)
Safety precautions to be maintained throughout the outpatient stay will include: orient to surroundings, keep bed in low position, maintain call bell within reach at all times, provide assistance with transfer out of bed and ambulation.  

## 2019-11-14 ENCOUNTER — Encounter: Payer: Self-pay | Admitting: Pain Medicine

## 2019-11-14 ENCOUNTER — Ambulatory Visit
Admission: RE | Admit: 2019-11-14 | Discharge: 2019-11-14 | Disposition: A | Discharge status: Home / Self Care | Payer: No Typology Code available for payment source | Source: Ambulatory Visit | Attending: Pain Medicine | Admitting: Pain Medicine

## 2019-11-14 ENCOUNTER — Ambulatory Visit (HOSPITAL_BASED_OUTPATIENT_CLINIC_OR_DEPARTMENT_OTHER): Payer: No Typology Code available for payment source | Admitting: Pain Medicine

## 2019-11-14 VITALS — BP 100/55 | HR 92 | Temp 98.7°F | Resp 16 | Ht 69.0 in | Wt 270.0 lb

## 2019-11-14 DIAGNOSIS — M24112 Other articular cartilage disorders, left shoulder: Secondary | ICD-10-CM | POA: Insufficient documentation

## 2019-11-14 DIAGNOSIS — M75102 Unspecified rotator cuff tear or rupture of left shoulder, not specified as traumatic: Secondary | ICD-10-CM | POA: Insufficient documentation

## 2019-11-14 DIAGNOSIS — S43431S Superior glenoid labrum lesion of right shoulder, sequela: Secondary | ICD-10-CM

## 2019-11-14 DIAGNOSIS — M67911 Unspecified disorder of synovium and tendon, right shoulder: Secondary | ICD-10-CM

## 2019-11-14 DIAGNOSIS — E559 Vitamin D deficiency, unspecified: Secondary | ICD-10-CM | POA: Insufficient documentation

## 2019-11-14 DIAGNOSIS — M19012 Primary osteoarthritis, left shoulder: Secondary | ICD-10-CM | POA: Insufficient documentation

## 2019-11-14 DIAGNOSIS — M25511 Pain in right shoulder: Secondary | ICD-10-CM | POA: Diagnosis not present

## 2019-11-14 DIAGNOSIS — M19011 Primary osteoarthritis, right shoulder: Secondary | ICD-10-CM

## 2019-11-14 DIAGNOSIS — G8929 Other chronic pain: Secondary | ICD-10-CM

## 2019-11-14 DIAGNOSIS — M25519 Pain in unspecified shoulder: Secondary | ICD-10-CM | POA: Insufficient documentation

## 2019-11-14 DIAGNOSIS — R700 Elevated erythrocyte sedimentation rate: Secondary | ICD-10-CM | POA: Insufficient documentation

## 2019-11-14 DIAGNOSIS — N1832 Chronic kidney disease, stage 3b: Secondary | ICD-10-CM | POA: Insufficient documentation

## 2019-11-14 DIAGNOSIS — M7591 Shoulder lesion, unspecified, right shoulder: Secondary | ICD-10-CM | POA: Diagnosis present

## 2019-11-14 DIAGNOSIS — M67921 Unspecified disorder of synovium and tendon, right upper arm: Secondary | ICD-10-CM | POA: Diagnosis present

## 2019-11-14 DIAGNOSIS — Z96619 Presence of unspecified artificial shoulder joint: Secondary | ICD-10-CM | POA: Insufficient documentation

## 2019-11-14 DIAGNOSIS — M24111 Other articular cartilage disorders, right shoulder: Secondary | ICD-10-CM | POA: Insufficient documentation

## 2019-11-14 DIAGNOSIS — R7982 Elevated C-reactive protein (CRP): Secondary | ICD-10-CM | POA: Diagnosis present

## 2019-11-14 DIAGNOSIS — M67922 Unspecified disorder of synovium and tendon, left upper arm: Secondary | ICD-10-CM | POA: Insufficient documentation

## 2019-11-14 DIAGNOSIS — M25512 Pain in left shoulder: Secondary | ICD-10-CM

## 2019-11-14 DIAGNOSIS — R454 Irritability and anger: Secondary | ICD-10-CM | POA: Insufficient documentation

## 2019-11-14 DIAGNOSIS — S43432S Superior glenoid labrum lesion of left shoulder, sequela: Secondary | ICD-10-CM | POA: Insufficient documentation

## 2019-11-14 MED ORDER — ERGOCALCIFEROL 1.25 MG (50000 UT) PO CAPS: 50000.0000 [IU] | ORAL_CAPSULE | ORAL | 0 refills | Status: AC

## 2019-11-14 MED ORDER — TURMERIC 500 MG PO CAPS: 500.0000 mg | ORAL_CAPSULE | Freq: Every day | ORAL | 0 refills | Status: AC

## 2019-11-14 MED ORDER — METHYLPREDNISOLONE ACETATE 80 MG/ML IJ SUSP
80.0000 mg | Freq: Once | INTRAMUSCULAR | Status: AC
  Administered 2019-11-14: 80 mg via INTRA_ARTICULAR

## 2019-11-14 MED ORDER — FENTANYL CITRATE (PF) 100 MCG/2ML IJ SOLN
INTRAMUSCULAR | Status: AC
  Filled 2019-11-14: qty 2

## 2019-11-14 MED ORDER — MAGNESIUM OXIDE -MG SUPPLEMENT 500 MG PO CAPS: 1.0000 | ORAL_CAPSULE | Freq: Every day | ORAL | 5 refills | Status: AC

## 2019-11-14 MED ORDER — METHYLPREDNISOLONE ACETATE 80 MG/ML IJ SUSP
INTRAMUSCULAR | Status: AC
  Filled 2019-11-14: qty 2

## 2019-11-14 MED ORDER — FENTANYL CITRATE (PF) 100 MCG/2ML IJ SOLN
25.0000 ug | INTRAMUSCULAR | Status: DC | PRN
  Administered 2019-11-14: 50 ug via INTRAVENOUS

## 2019-11-14 MED ORDER — METHYLPREDNISOLONE ACETATE 80 MG/ML IJ SUSP
80.0000 mg | Freq: Once | INTRAMUSCULAR | Status: AC
  Administered 2019-11-14: 80 mg

## 2019-11-14 MED ORDER — MIDAZOLAM HCL 5 MG/5ML IJ SOLN
INTRAMUSCULAR | Status: AC
  Filled 2019-11-14: qty 5

## 2019-11-14 MED ORDER — MIDAZOLAM HCL 5 MG/5ML IJ SOLN
1.0000 mg | INTRAMUSCULAR | Status: DC | PRN
  Administered 2019-11-14: 2 mg via INTRAVENOUS
  Administered 2019-11-14: 1 mg via INTRAVENOUS

## 2019-11-14 MED ORDER — LACTATED RINGERS IV SOLN
1000.0000 mL | Freq: Once | INTRAVENOUS | Status: AC
  Administered 2019-11-14: 1000 mL via INTRAVENOUS

## 2019-11-14 MED ORDER — ROPIVACAINE HCL 2 MG/ML IJ SOLN
9.0000 mL | Freq: Once | INTRAMUSCULAR | Status: AC
  Administered 2019-11-14: 9 mL via PERINEURAL

## 2019-11-14 MED ORDER — LIDOCAINE HCL 2 % IJ SOLN
20.0000 mL | Freq: Once | INTRAMUSCULAR | Status: AC
  Administered 2019-11-14: 400 mg

## 2019-11-14 MED ORDER — ROPIVACAINE HCL 2 MG/ML IJ SOLN
9.0000 mL | Freq: Once | INTRAMUSCULAR | Status: AC
  Administered 2019-11-14: 9 mL via INTRA_ARTICULAR

## 2019-11-14 MED ORDER — VITAMIN D3 125 MCG (5000 UT) PO CAPS: 1.0000 | ORAL_CAPSULE | Freq: Every day | ORAL | 5 refills | Status: AC

## 2019-11-14 MED ORDER — LIDOCAINE HCL 2 % IJ SOLN
INTRAMUSCULAR | Status: AC
  Filled 2019-11-14: qty 20

## 2019-11-14 MED ORDER — ROPIVACAINE HCL 2 MG/ML IJ SOLN
INTRAMUSCULAR | Status: AC
  Filled 2019-11-14: qty 20

## 2019-11-14 NOTE — Progress Notes (Signed)
Safety precautions to be maintained throughout the outpatient stay will include: orient to surroundings, keep bed in low position, maintain call bell within reach at all times, provide assistance with transfer out of bed and ambulation.  

## 2019-11-14 NOTE — Patient Instructions (Signed)

## 2019-11-14 NOTE — Progress Notes (Signed)
PROVIDER NOTE: Information contained herein reflects review and annotations entered in association with encounter. Interpretation of such information and data should be left to medically-trained personnel. Information provided to patient can be located elsewhere in the medical record under "Patient Instructions". Document created using STT-dictation technology, any transcriptional errors that may result from process are unintentional.    Patient: [name]  Service Category: Procedure  Provider: Oswaldo Done, MD  DOB: November 19, 1945  DOS: 11/14/2019  Location: [facility]  MRN: [id number]  Setting: Ambulatory - outpatient  Referring Provider: No ref. provider found  Type: Established Patient  Specialty: Interventional Pain Management  PCP: Patient, No Pcp Per   Primary Reason for Visit: Interventional Pain Management Treatment. CC: No chief complaint on file.  Procedure #1:  Anesthesia, Analgesia, Anxiolysis:  Type: Therapeutic right-sided glenohumeral, bilateral acromioclavicular joint, and bilateral biceps tendon Injection #1  Primary Purpose: Diagnostic/Therapeutric Region: Superior Shoulder Area Level:  Shoulder Target Area: Glenohumeral and acromioclavicular joint Approach: Anterior approach. Laterality: Bilateral (no glenohumeral injection on the left)  Type: Moderate (Conscious) Sedation combined with Local Anesthesia Indication(s): Analgesia and Anxiety Route: Intravenous (IV) IV Access: Secured Sedation: Meaningful verbal contact was maintained at all times during the procedure  Local Anesthetic: Lidocaine 1-2%  Position: Supine   Indications: 1. Chronic shoulder pain (1ry area of Pain) (Bilateral) (R>L)   2. 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 Arthropathy of shoulder (Right)   4. Glenoid labrum tear, sequela (Right)   5. Osteoarthritis of AC (acromioclavicular) joints (Bilateral)   6. Osteoarthritis of shoulders  (Bilateral)   7. Supraspinatus tendonitis (Right)   8. Tendinopathy of rotator cuff (Right)   9. Biceps tendinopathy (Right)    Pain Score: Pre-procedure: 6 /10 Post-procedure: 0-No pain/10   Pre-op Assessment:  [name] is a 74 y.o. (year old), male patient, seen today for interventional treatment. 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 [name] has a current medication list which includes the following prescription(s): acetaminophen, aspirin ec, carvedilol, fluoxetine, furosemide, hydrocodone-acetaminophen, levothyroxine, multivitamins ther. w/minerals, nitroglycerin, omega-3 acid ethyl esters, ranitidine, simvastatin, tamsulosin, vitamin d3, ergocalciferol, magnesium oxide, and turmeric, and the following Facility-Administered Medications: fentanyl and midazolam. His primarily concern today is the No chief complaint on file.  Initial Vital Signs:  Pulse/HCG Rate: 98ECG Heart Rate: 96 Temp: 98 F (36.7 C) Resp: 18 BP: (!) 135/56 SpO2: 98 %  BMI: Estimated body mass index is 39.87 kg/m as calculated from the following:   Height as of this encounter:  (1.753 m).   Weight as of this encounter: 270 lb (122.5 kg).  Risk Assessment: Allergies: Reviewed. He is allergic to sulfa antibiotics.  Allergy Precautions: None required Coagulopathies: Reviewed. None identified.  Blood-thinner therapy: None at this time Active Infection(s): Reviewed. None identified. [name] is afebrile  Site Confirmation: [name] was asked to confirm the procedure and laterality before marking the site Procedure checklist: Completed Consent: Before the procedure and under the influence of no sedative(s), amnesic(s), or anxiolytics, the patient was informed of the treatment options, risks and possible complications. To fulfill our  ethical and legal obligations, as recommended by the American Medical Association's Code of Ethics, I have informed the patient of my clinical impression; the nature and purpose of the treatment or procedure; the risks, benefits, and possible complications of the intervention; the alternatives, including doing  nothing; the risk(s) and benefit(s) of the alternative treatment(s) or procedure(s); and the risk(s) and benefit(s) of doing nothing. The patient was provided information about the general risks and possible complications associated with the procedure. These may include, but are not limited to: failure to achieve desired goals, infection, bleeding, organ or nerve damage, allergic reactions, paralysis, and death. In addition, the patient was informed of those risks and complications associated to the procedure, such as failure to decrease pain; infection; bleeding; organ or nerve damage with subsequent damage to sensory, motor, and/or autonomic systems, resulting in permanent pain, numbness, and/or weakness of one or several areas of the body; allergic reactions; (i.e.: anaphylactic reaction); and/or death. Furthermore, the patient was informed of those risks and complications associated with the medications. These include, but are not limited to: allergic reactions (i.e.: anaphylactic or anaphylactoid reaction(s)); adrenal axis suppression; blood sugar elevation that in diabetics may result in ketoacidosis or comma; water retention that in patients with history of congestive heart failure may result in shortness of breath, pulmonary edema, and decompensation with resultant heart failure; weight gain; swelling or edema; medication-induced neural toxicity; particulate matter embolism and blood vessel occlusion with resultant organ, and/or nervous system infarction; and/or aseptic necrosis of one or more joints. Finally, the patient was informed that Medicine is not an exact science; therefore, there is also  the possibility of unforeseen or unpredictable risks and/or possible complications that may result in a catastrophic outcome. The patient indicated having understood very clearly. We have given the patient no guarantees and we have made no promises. Enough time was given to the patient to ask questions, all of which were answered to the patient's satisfaction. Mr. Cloninger has indicated that he wanted to continue with the procedure. Attestation: I, the ordering provider, attest that I have discussed with the patient the benefits, risks, side-effects, alternatives, likelihood of achieving goals, and potential problems during recovery for the procedure that I have provided informed consent. Date  Time: 11/14/2019  7:46 AM  Pre-Procedure Preparation:  Monitoring: As per clinic protocol. Respiration, ETCO2, SpO2, BP, heart rate and rhythm monitor placed and checked for adequate function Safety Precautions: Patient was assessed for positional comfort and pressure points before starting the procedure. Time-out: I initiated and conducted the "Time-out" before starting the procedure, as per protocol. The patient was asked to participate by confirming the accuracy of the "Time Out" information. Verification of the correct person, site, and procedure were performed and confirmed by me, the nursing staff, and the patient. "Time-out" conducted as per Joint Commission's Universal Protocol (UP.01.01.01). Time: 0831  Description of Procedure #1:  Area Prepped: Entire shoulder Area DuraPrep (Iodine Povacrylex [0.7% available iodine] and Isopropyl Alcohol, 74% w/w) Safety Precautions: Aspiration looking for blood return was conducted prior to all injections. At no point did we inject any substances, as a needle was being advanced. No attempts were made at seeking any paresthesias. Safe injection practices and needle disposal techniques used. Medications properly checked for expiration dates. SDV (single dose vial)  medications used. Description of the Procedure: Protocol guidelines were followed. The patient was placed in position over the procedure table. The target area was identified and the area prepped in the usual manner. Skin & deeper tissues infiltrated with local anesthetic. Appropriate amount of time allowed to pass for local anesthetics to take effect. The procedure needles were then advanced to the target area. Proper needle placement secured. Negative aspiration confirmed. Solution injected in intermittent fashion, asking for systemic symptoms every 0.5cc  of injectate. The needles were then removed and the area cleansed, making sure to leave some of the prepping solution back to take advantage of its long term bactericidal properties.         Vitals:   11/14/19 0855 11/14/19 0901 11/14/19 0911 11/14/19 0921  BP: 115/63 (!) 98/51 (!) 94/53 (!) 100/55  Pulse: 92     Resp: 18 (!) 22 20 16   Temp:  97.9 F (36.6 C)  98.7 F (37.1 C)  TempSrc:  Temporal  Temporal  SpO2: 96% 95% 96% 95%  Weight:      Height:        Start Time: 0833 hrs. End Time: 0855 hrs. Materials:  Needle(s) Type: Spinal Needle Gauge: 22G Length: 3.5-in Medication(s): Please see orders for medications and dosing details.  Imaging Guidance (Non-Spinal) for procedure #1:  Type of Imaging Technique: Fluoroscopy Guidance (Non-Spinal) Indication(s): Assistance in needle guidance and placement for procedures requiring needle placement in or near specific anatomical locations not easily accessible without such assistance. Exposure Time: Please see nurses notes. Contrast: None used. Fluoroscopic Guidance: I was personally present during the use of fluoroscopy. "Tunnel Vision Technique" used to obtain the best possible view of the target area. Parallax error corrected before commencing the procedure. "Direction-depth-direction" technique used to introduce the needle under continuous pulsed fluoroscopy. Once target was reached,  antero-posterior, oblique, and lateral fluoroscopic projection used confirm needle placement in all planes. Images permanently stored in EMR. Interpretation: No contrast injected. I personally interpreted the imaging intraoperatively. Adequate needle placement confirmed in multiple planes. Permanent images saved into the patient's record.  Procedure #2:  Anesthesia, Analgesia, Anxiolysis:  Type: Diagnostic Suprascapular nerve Block #1  Primary Purpose: Diagnostic Region: Posterior Shoulder & Scapular Areas Level: Superior to the scapular spine, in the lateral aspect of the supraspinatus fossa (Suprescapular notch). Target Area: Suprascapular nerve as it passes thru the lower portion of the suprascapular notch. Approach: Posterior percutaneous approach. Laterality: Left-Side  Type: Moderate (Conscious) Sedation combined with Local Anesthesia Indication(s): Analgesia and Anxiety Route: Intravenous (IV) IV Access: Secured Sedation: Meaningful verbal contact was maintained at all times during the procedure  Local Anesthetic: Lidocaine 1-2%  Position: Prone   Indications: 1. Chronic shoulder pain (1ry area of Pain) (Bilateral) (R>L)   2. 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 At no point did we inject any substances, as a needle was being advanced. No attempts were made at seeking any paresthesias. Safe injection practices and needle disposal techniques used. Medications properly checked for expiration dates. SDV (single dose vial) medications used. Description of the Procedure: Protocol guidelines were followed. The patient was placed in position over the procedure table. The target area was identified and the area prepped  in the usual manner. Skin & deeper tissues infiltrated with local anesthetic. Appropriate amount of time allowed to pass for local anesthetics to take effect. The procedure needles were then advanced to the target area. Proper needle placement secured. Negative aspiration confirmed. Solution injected in intermittent fashion, asking for systemic symptoms every 0.5cc of injectate. The needles were then removed and the area cleansed, making sure to leave some of the prepping solution back to take advantage of its long term bactericidal properties.  Vitals:   11/14/19 1610 11/14/19 0901 11/14/19 9604  11/14/19 0921  BP: 115/63 (!) 98/51 (!) 94/53 (!) 100/55  Pulse: 92     Resp: 18 (!) 22 20 16   Temp:  97.9 F (36.6 C)  98.7 F (37.1 C)  TempSrc:  Temporal  Temporal  SpO2: 96% 95% 96% 95%  Weight:      Height:        Start Time: 0833 hrs. End Time: 0855 hrs. Materials:  Needle(s) Type: Spinal Needle Gauge: 22G Length: 3.5-in Medication(s): Please see orders for medications and dosing details.  Imaging Guidance (Non-Spinal) for procedure #2:  Type of Imaging Technique: Fluoroscopy Guidance (Non-Spinal) Indication(s): Assistance in needle guidance and placement for procedures requiring needle placement in or near specific anatomical locations not easily accessible without such assistance. Exposure Time: Please see nurses notes. Contrast: Before injecting any contrast, we confirmed that the patient did not have an allergy to iodine, shellfish, or radiological contrast. Once satisfactory needle placement was completed at the desired level, radiological contrast was injected. Contrast injected under live fluoroscopy. No contrast complications. See chart for type and volume of contrast used. Fluoroscopic Guidance: I was personally present during the use of fluoroscopy. "Tunnel Vision Technique" used to obtain the best possible view of the target area. Parallax error corrected before commencing the  procedure. "Direction-depth-direction" technique used to introduce the needle under continuous pulsed fluoroscopy. Once target was reached, antero-posterior, oblique, and lateral fluoroscopic projection used confirm needle placement in all planes. Images permanently stored in EMR. Interpretation: I personally interpreted the imaging intraoperatively. Adequate needle placement confirmed in multiple planes. Appropriate spread of contrast into desired area was observed. No evidence of afferent or efferent intravascular uptake. Permanent images saved into the patient's record.  Antibiotic Prophylaxis:   Anti-infectives (From admission, onward)   None     Indication(s): None identified  Post-operative Assessment:  Post-procedure Vital Signs:  Pulse/HCG Rate: 9293 Temp: 98.7 F (37.1 C) Resp: 16 BP: (!) 100/55 SpO2: 95 %  EBL: None  Complications: No immediate post-treatment complications observed by team, or reported by patient.  Note: The patient tolerated the entire procedure well. A repeat set of vitals were taken after the procedure and the patient was kept under observation following institutional policy, for this type of procedure. Post-procedural neurological assessment was performed, showing return to baseline, prior to discharge. The patient was provided with post-procedure discharge instructions, including a section on how to identify potential problems. Should any problems arise concerning this procedure, the patient was given instructions to immediately contact , at any time, without hesitation. In any case, we plan to contact the patient by telephone for a follow-up status report regarding this interventional procedure.  Comments:  No additional relevant information.  Plan of Care  Orders:  Orders Placed This Encounter  Procedures  . SHOULDER INJECTION    Scheduling Instructions:     Side: Bilateral     Sedation: Patient's choice.     Timeframe: Today    Order Specific  Question:   Where will this procedure be performed?    Answer:   [facility]    Comments:   by Dr. [place]  . SUPRASCAPULAR NERVE BLOCK    For shoulder pain.    Scheduling Instructions:     Purpose: Diagnostic     Laterality: Left-sided     Level(s): Suprascapular notch     Sedation: Patient's choice.     Scheduling Timeframe: Today    Order Specific Question:   Where will this procedure be performed?  Answer:   [facility]  . DG [facility] C-ARM 1-60 MIN NO REPORT    Intraoperative interpretation by procedural physician at [place].    Standing Status:   Standing    Number of Occurrences:   1    Order Specific Question:   Reason for exam:    Answer:   Assistance in needle guidance and placement for procedures requiring needle placement in or near specific anatomical locations not easily accessible without such assistance.  . Informed Consent Details: Physician/Practitioner Attestation; Transcribe to consent form and obtain patient signature    Nursing Order: Transcribe to consent form and obtain patient signature. Note: Always confirm laterality of pain with Mr. Gancarz, before procedure. Procedure: Shoulder joint injection (glenohumeral and/or acromioclavicular joint) Indication/Reason: Diagnosis and/for treatment of shoulder pain (arthralgia) secondary to shoulder joint problems (arthropathy). Provider Attestation: I, [place] Dossie Arbour, MD, (Pain Management Specialist), the physician/practitioner, attest that I have discussed with the patient the benefits, risks, side effects, alternatives, likelihood of achieving goals and potential problems during recovery for the procedure that I have provided informed consent.  . Informed Consent Details: Physician/Practitioner Attestation; Transcribe to consent form and obtain patient signature    Nursing Order: Transcribe to consent form and obtain patient signature. Note: Always confirm laterality of pain  with Mr. Micheletti, before procedure. Procedure: Suprascapular nerve block Indication/Reason: Diagnosis and/for treatment of shoulder pain (arthralgia) secondary to shoulder joint problems (arthropathy). Provider Attestation: I, [place] Dossie Arbour, MD, (Pain Management Specialist), the physician/practitioner, attest that I have discussed with the patient the benefits, risks, side effects, alternatives, likelihood of achieving goals and potential problems during recovery for the procedure that I have provided informed consent.  . Provide equipment / supplies at bedside    Equipment required: Single use, disposable, "Block Tray"    Standing Status:   Standing    Number of Occurrences:   1    Order Specific Question:   Specify    Answer:   Block Tray   Chronic Opioid Analgesic:  Hydrocodone/APAP 10/325 1 tablet p.o. every 8 hours (30 mg/day of hydrocodone) (30 MME/day)  Highest recorded MME/day: 30 mg/day MME/day: 30 mg/day   Medications ordered for procedure: Meds ordered this encounter  Medications  . Cholecalciferol (VITAMIN D3) 125 MCG (5000 UT) CAPS    Sig: Take 1 capsule (5,000 Units total) by mouth daily with breakfast. Take along with calcium and magnesium.    Dispense:  30 capsule    Refill:  5    Fill one day early if pharmacy is closed on scheduled refill date. May substitute for generic, or similar, if available.  . ergocalciferol (VITAMIN D2) 1.25 MG (50000 UT) capsule    Sig: Take 1 capsule (50,000 Units total) by mouth 2 (two) times a week. X 6 weeks.    Dispense:  12 capsule    Refill:  0    Fill one day early if pharmacy is closed on scheduled refill date. May substitute for generic, or similar, if available.  . Magnesium Oxide 500 MG CAPS    Sig: Take 1 capsule (500 mg total) by mouth daily with breakfast.    Dispense:  30 capsule    Refill:  5    Fill one day early if pharmacy is closed on scheduled refill date. May substitute for generic if available.  . Turmeric  500 MG CAPS    Sig: Take 500 mg by mouth daily.    Dispense:  180 capsule  Refill:  0    OTC Recommendation. Please provide information on: where to find; how to take; side-effects; adverse reactions; drug-to-drug interactions; and contraindications. If unavailable, recommend similar substitute.  . lidocaine (XYLOCAINE) 2 % (with pres) injection 400 mg  . lactated ringers infusion 1,000 mL  . midazolam (VERSED) 5 MG/5ML injection 1-2 mg    Make sure Flumazenil is available in the pyxis when using this medication. If oversedation occurs, administer 0.2 mg IV over 15 sec. If after 45 sec no response, administer 0.2 mg again over 1 min; may repeat at 1 min intervals; not to exceed 4 doses (1 mg)  . fentaNYL (SUBLIMAZE) injection 25-50 mcg    Make sure Narcan is available in the pyxis when using this medication. In the event of respiratory depression (RR< 8/min): Titrate NARCAN (naloxone) in increments of 0.1 to 0.2 mg IV at 2-3 minute intervals, until desired degree of reversal.  . methylPREDNISolone acetate (DEPO-MEDROL) injection 80 mg  . ropivacaine (PF) 2 mg/mL (0.2%) (NAROPIN) injection 9 mL  . ropivacaine (PF) 2 mg/mL (0.2%) (NAROPIN) injection 9 mL  . methylPREDNISolone acetate (DEPO-MEDROL) injection 80 mg   Medications administered: We administered lidocaine, lactated ringers, midazolam, fentaNYL, methylPREDNISolone acetate, ropivacaine (PF) 2 mg/mL (0.2%), ropivacaine (PF) 2 mg/mL (0.2%), and methylPREDNISolone acetate.  See the medical record for exact dosing, route, and time of administration.  Follow-up plan:   Return in about 2 weeks (around 11/28/2019) for (VV), (PP).      Interventional treatment options: Planned, scheduled, and/or pending:   Therapeutic IA right sided glenohumeral, bilateral acromioclavicular, and bilateral biceps tendon injection #1 + diagnostic left-sided suprascapular nerve block #1  (today)   Under consideration:   Diagnostic/therapeutic bilateral  acromioclavicular joint injection  Diagnostic/therapeutic right IA shoulder joint injection  Diagnostic left suprascapular nerve block  Possible therapeutic left suprascapular nerve RFA #1    Therapeutic/palliative (PRN):   None at this time    Recent Visits Date Type Provider Dept  11/13/19 Office Visit Delano Metz, MD [facility]  Showing recent visits within past 90 days and meeting all other requirements Today's Visits Date Type Provider Dept  11/14/19 Procedure visit Delano Metz, MD [facility]  Showing today's visits and meeting all other requirements Future Appointments Date Type Provider Dept  12/02/19 Appointment Delano Metz, MD [facility]  Showing future appointments within next 90 days and meeting all other requirements  Disposition: Discharge home  Discharge (Date  Time): 11/14/2019; 0930 hrs.   Primary Care Physician: Patient, No Pcp Per Location: [facility] Note by: Oswaldo Done, MD Date: 11/14/2019; Time: 9:56 AM  Disclaimer:  Medicine is not an Visual merchandiser. The only guarantee in medicine is that nothing is guaranteed. It is important to note that the decision to proceed with this intervention was based on the information collected from the patient. The Data and conclusions were drawn from the patient's questionnaire, the interview, and the physical examination. Because the information was provided in large part by the patient, it cannot be guaranteed that it has not been purposely or unconsciously manipulated. Every effort has been made to obtain as much relevant data as possible for this evaluation. It is important to note that the conclusions that lead to this procedure are derived in large part from the available data. Always take into account that the treatment will also be dependent on availability of resources and existing treatment guidelines, considered by other Pain  Management Practitioners as being  common knowledge and practice, at the time of the intervention. For Medico-Legal purposes, it is also important to point out that variation in procedural techniques and pharmacological choices are the acceptable norm. The indications, contraindications, technique, and results of the above procedure should only be interpreted and judged by a Board-Certified Interventional Pain Specialist with extensive familiarity and expertise in the same exact procedure and technique.

## 2019-11-15 ENCOUNTER — Telehealth: Payer: Self-pay

## 2019-11-15 NOTE — Telephone Encounter (Signed)
Post procedure phone call.  LM 

## 2019-11-19 LAB — COMPLIANCE DRUG ANALYSIS, UR

## 2019-12-02 ENCOUNTER — Telehealth: Payer: No Typology Code available for payment source | Admitting: Pain Medicine

## 2019-12-10 ENCOUNTER — Encounter: Payer: Self-pay | Admitting: Pain Medicine

## 2019-12-10 NOTE — Progress Notes (Signed)
Patient: [name]  Service Category: E/M  Provider: Gaspar Cola, MD  DOB: [birth year]  DOS: 12/11/2019  Location: Office  MRN: [id number]  Setting: Ambulatory outpatient  Referring Provider: No ref. provider found  Type: Established Patient  Specialty: Interventional Pain Management  PCP: Patient, No Pcp Per  Location: Remote location  Delivery: TeleHealth     Virtual Encounter - Pain Management PROVIDER NOTE: Information contained herein reflects review and annotations entered in association with encounter. Interpretation of such information and data should be left to medically-trained personnel. Information provided to patient can be located elsewhere in the medical record under "Patient Instructions". Document created using STT-dictation technology, any transcriptional errors that may result from process are unintentional.    Contact & Pharmacy Preferred: [place]: [phone] (home) Mobile: There is no such number on file (mobile). E-mail: No e-mail address on record  [id number] [place] [address] [id number]: [phone]: [phone]  Pre-screening  [name] "in-person" vs "virtual" encounter. He indicated preferring virtual for this encounter.   Reason COVID-19*  Social distancing based on CDC and AMA recommendations.   I contacted [name] on 12/11/2019 via telephone.      I clearly identified myself as FGaspar Cola MD. I verified that I was speaking with the correct person using two identifiers (Name: [name] and date of birth: 73-Feb-1947.  Consent I sought verbal advanced consent from VLamontfor virtual visit interactions. I informed [name] possible security and privacy concerns, risks, and limitations associated with providing "not-in-person" medical evaluation and  management services. I also informed [name] the availability of "in-person" appointments. Finally, I informed him that there would be a charge for the virtual visit and that he could be  personally, fully or partially, financially responsible for it. [name] understanding and agreed to proceed.   Historic Elements   Mr. VMCKINNLEY SMITHEYis a 74y.o. year old, male patient evaluated today after his last contact with our practice on 11/15/2019. [name] has a past medical history of CAD (coronary artery disease), native coronary artery, CHF (congestive heart failure) ([place], CKD (chronic kidney disease) stage 3, GFR 30-59 ml/min, Hyperlipidemia, Hypertension, Hypothyroidism, Myocardial infarction ([place], and Nephrolithiasis. 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 [name] a current medication list which includes the following prescription(s): acetaminophen, aspirin ec, carvedilol, vitamin d3, ergocalciferol, fluoxetine, furosemide, hydrocodone-acetaminophen, levothyroxine, magnesium oxide, multivitamins ther. w/minerals, nitroglycerin, omega-3 acid ethyl esters, ranitidine, simvastatin, tamsulosin, and turmeric. He  reports that he has never smoked. He has never used smokeless tobacco. He reports that he does not drink alcohol and does not use drugs. [name] allergic to sulfa antibiotics.   HPI  Today, he is being contacted for a post-procedure assessment.  The patient indicates that the procedure did much better on the right side where we did the intra-articular shoulder joint injection done on the left.  Of course, on the left side he has a partial shoulder replacement and therefore we could not go intra-articularly.  Today I took the time to review the patient's results and we have decided to  modify his plan to essentially do a diagnostic bilateral suprascapular nerve block under fluoroscopic guidance.  I  spoke to the patient about this alternative and the possibility that if effective in controlling the pain at least for the duration of the local anesthetic, this may be a way of treating this pain for a longer period of time by doing radiofrequency ablation of the suprascapular nerve on either side, or bilaterally.  He understood and accepted.  Post-Procedure Evaluation  Procedure: Diagnostic/therapeutic right-sided glenohumeral, bilateral acromioclavicular joint, (Bilateral) tendon injection #1 under fluoroscopic guidance and IV sedation Pre-procedure pain level: 6/10 Post-procedure: 0/10 (100% relief)  Sedation: Sedation provided.  Effectiveness during initial hour after procedure(Ultra-Short Term Relief):  100%.  Local anesthetic used: Long-acting (4-6 hours) Effectiveness: Defined as any analgesic benefit obtained secondary to the administration of local anesthetics. This carries significant diagnostic value as to the etiological location, or anatomical origin, of the pain. Duration of benefit is expected to coincide with the duration of the local anesthetic used.  Effectiveness during initial 4-6 hours after procedure(Short-Term Relief): 70-100 % for a couple of hours.  Long-term benefit: Defined as any relief past the pharmacologic duration of the local anesthetics.  Effectiveness past the initial 6 hours after procedure(Long-Term Relief): 30 %.  Current benefits: Defined as benefit that persist at this time.   Analgesia:  According to the patient he has attained a 40 to 50% improvement in both shoulders.Marland Kitchen  Specifically he refers having attained 50% benefit on the right shoulder, which is the one that we did the intra-articular shoulder joint injection and only 30% improvement on the left were he has a total shoulder replacement. Function: Somewhat improved ROM: Somewhat  improved  Pharmacotherapy Assessment  Analgesic: Hydrocodone/APAP 10/325 1 tablet p.o. every 8 hours (30 mg/day of hydrocodone) (30 MME/day)  Highest recorded MME/day: 30 mg/day MME/day: 30 mg/day   Monitoring: [place] PMP: PDMP reviewed during this encounter.       Pharmacotherapy: No side-effects or adverse reactions reported. Compliance: No problems identified. Effectiveness: Clinically acceptable. Plan: Refer to "POC".  UDS:  Summary  Date Value Ref Range Status  11/13/2019 Note  Final    Comment:    ==================================================================== Compliance Drug Analysis, Ur ==================================================================== Test                             Result       Flag       Units  Drug Present and Declared for Prescription Verification   Hydrocodone                    2751         EXPECTED   ng/mg creat   Hydromorphone                  54           EXPECTED   ng/mg creat   Dihydrocodeine                 328          EXPECTED   ng/mg creat   Norhydrocodone                 1540         EXPECTED   ng/mg creat    Sources of hydrocodone include scheduled prescription medications.    Hydromorphone, dihydrocodeine and norhydrocodone are expected    metabolites of hydrocodone. Hydromorphone and dihydrocodeine are    also available as scheduled prescription medications.    Fluoxetine  PRESENT      EXPECTED   Norfluoxetine                  PRESENT      EXPECTED    Norfluoxetine is an expected metabolite of fluoxetine.    Acetaminophen                  PRESENT      EXPECTED  Drug Present not Declared for Prescription Verification   Lidocaine                      PRESENT      UNEXPECTED   Metoprolol                     PRESENT      UNEXPECTED  Drug Absent but Declared for Prescription Verification   Salicylate                     Not Detected UNEXPECTED    Aspirin, as indicated in the declared medication list, is not  always    detected even when used as directed.  ==================================================================== Test                      Result    Flag   Units      Ref Range   Creatinine              302              mg/dL      >=20 ==================================================================== Declared Medications:  The flagging and interpretation on this report are based on the  following declared medications.  Unexpected results may arise from  inaccuracies in the declared medications.   **Note: The testing scope of this panel includes these medications:   Fluoxetine (Prozac)  Hydrocodone (Norco)   **Note: The testing scope of this panel does not include small to  moderate amounts of these reported medications:   Acetaminophen (Tylenol)  Acetaminophen (Norco)  Aspirin   **Note: The testing scope of this panel does not include the  following reported medications:   Carvedilol (Coreg)  Furosemide (Lasix)  Levothyroxine (Synthroid)  Magnesium (Mag-Ox)  Multivitamin  Nitroglycerin (Nitrostat)  Omega-3 Fatty Acids  Ranitidine (Zantac)  Simvastatin (Zocor)  Tamsulosin (Flomax)  Turmeric  Vitamin D2  Vitamin D3 ==================================================================== For clinical consultation, please call [phone]. ====================================================================     Laboratory Chemistry Profile   Renal Lab Results  Component Value Date   BUN 22 11/13/2019   CREATININE 1.70 (H) 11/13/2019   GFRAA 46 (L) 11/13/2019   GFRNONAA 39 (L) 11/13/2019     Hepatic Lab Results  Component Value Date   AST 16 11/13/2019   ALT 12 11/13/2019   ALBUMIN 3.9 11/13/2019   ALKPHOS 87 11/13/2019     Electrolytes Lab Results  Component Value Date   NA 136 11/13/2019   K 4.3 11/13/2019   CL 102 11/13/2019   CALCIUM 9.5 11/13/2019   MG 1.6 (L) 11/13/2019     Bone Lab Results  Component Value Date   VD25OH 23.00 (L)  11/13/2019     Inflammation (CRP: Acute Phase) (ESR: Chronic Phase) Lab Results  Component Value Date   CRP 4.3 (H) 11/13/2019   ESRSEDRATE 63 (H) 11/13/2019       Note: Above Lab results reviewed.   Imaging  DG Shoulder Left CLINICAL DATA:  Pain  EXAM: LEFT SHOULDER -  2+ VIEW  COMPARISON:  None.  FINDINGS: Oblique, Y scapular, and axillary images obtained. There is a total shoulder replacement on the left with prosthetic components well-seated. No fracture or dislocation. There is narrowing of the glenohumeral joint. Remodeling in the glenoid is likely due to prior arthropathy. No erosive change. Pacemaker present on the left.  IMPRESSION: Total shoulder replacement on the left with prosthetic components well-seated. Underlying osteoarthritic change. No acute fracture or dislocation.  Electronically Signed   By: Lowella Grip III M.D.   On: 11/14/2019 11:17 DG Shoulder Right CLINICAL DATA:  RIGHT shoulder pain.  EXAM: RIGHT SHOULDER - 2+ VIEW  COMPARISON:  None.  FINDINGS: There is severe joint space narrowing of the glenohumeral joint. There is sclerosis of the humeral head as well as osteophytosis. No acute fracture dislocation identified.  IMPRESSION: 1. 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 2. No acute findings identified.  Electronically Signed   By: Suzy Bouchard M.D.   On: 11/14/2019 11:16 DG PAIN CLINIC C-ARM 1-60 MIN NO REPORT Fluoro was used, but no Radiologist interpretation will be provided.  Please refer to "NOTES" tab for provider progress note.  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 A diagnosis of Chronic shoulder pain (Right) was also pertinent to this visit.  Plan of Care  Problem-specific:  No problem-specific Assessment & Plan notes found for this encounter.  Mr. Alferd Obryant Mcphee has a current medication list which includes the  following long-term medication(s): carvedilol, vitamin d3, ergocalciferol, fluoxetine, levothyroxine, magnesium oxide, nitroglycerin, omega-3 acid ethyl esters, ranitidine, simvastatin, and turmeric.  Pharmacotherapy (Medications Ordered): No orders of the defined types were placed in this encounter.  Orders:  Orders Placed This Encounter  Procedures  . SUPRASCAPULAR NERVE BLOCK    For shoulder pain.    Standing Status:   Future    Standing Expiration Date:   03/12/2020    Scheduling Instructions:     Purpose: Diagnostic     Laterality: Bilateral     Level(s): Suprascapular notch     Sedation: Patient's choice.     Scheduling Timeframe: As permitted by the schedule    Order Specific Question:   Where will this procedure be performed?    Answer:   [facility]   Follow-up plan:   Return for Procedure (w/ sedation): (B) SSNB #1.      Interventional treatment options: Planned, scheduled, and/or pending:   Therapeutic IA right sided glenohumeral, bilateral acromioclavicular, and bilateral biceps tendon injection #1 + diagnostic left-sided suprascapular nerve block #1  (today)   Under consideration:   Diagnostic/therapeutic bilateral acromioclavicular joint injection  Diagnostic/therapeutic right IA shoulder joint injection  Diagnostic left suprascapular nerve block  Possible therapeutic left suprascapular nerve RFA #1    Therapeutic/palliative (PRN):   None at this time     Recent Visits Date Type Provider Dept  11/14/19 Procedure visit Milinda Pointer, MD [facility]  11/13/19 Office Visit Milinda Pointer, MD [facility]  Showing recent visits within past 90 days and meeting all other requirements Today's Visits Date Type Provider Dept  12/11/19 Telemedicine Milinda Pointer, MD [facility]  Showing today's visits and meeting all other requirements Future Appointments No visits were found meeting these conditions. Showing  future appointments within next 90 days and meeting all other requirements  I discussed the assessment and treatment plan with the patient. The patient was provided an opportunity to ask questions and all  were answered. The patient agreed with the plan and demonstrated an understanding of the instructions.  Patient advised to call back or seek an in-person evaluation if the symptoms or condition worsens.  Duration of encounter: 18 minutes.  Note by: Gaspar Cola, MD Date: 12/11/2019; Time: 1:50 PM

## 2019-12-11 ENCOUNTER — Ambulatory Visit: Payer: No Typology Code available for payment source | Attending: Pain Medicine | Admitting: Pain Medicine

## 2019-12-11 ENCOUNTER — Other Ambulatory Visit: Payer: Self-pay

## 2019-12-11 ENCOUNTER — Encounter: Payer: No Typology Code available for payment source | Admitting: Pain Medicine

## 2019-12-11 DIAGNOSIS — Z96619 Presence of unspecified artificial shoulder joint: Secondary | ICD-10-CM | POA: Diagnosis not present

## 2019-12-11 DIAGNOSIS — M25519 Pain in unspecified shoulder: Secondary | ICD-10-CM | POA: Diagnosis not present

## 2019-12-11 DIAGNOSIS — M25511 Pain in right shoulder: Secondary | ICD-10-CM

## 2019-12-11 DIAGNOSIS — G8929 Other chronic pain: Secondary | ICD-10-CM | POA: Diagnosis not present

## 2019-12-11 NOTE — Patient Instructions (Signed)
____________________________________________________________________________________________  Preparing for Procedure with Sedation  Procedure appointments are limited to planned procedures: . No Prescription Refills. . No disability issues will be discussed. . No medication changes will be discussed.  Instructions: . Oral Intake: Do not eat or drink anything for at least 8 hours prior to your procedure. (Exception: Blood Pressure Medication. See below.) . Transportation: Unless otherwise stated by your physician, you may drive yourself after the procedure. . Blood Pressure Medicine: Do not forget to take your blood pressure medicine with a sip of water the morning of the procedure. If your Diastolic (lower reading)is above 100 mmHg, elective cases will be cancelled/rescheduled. . Blood thinners: These will need to be stopped for procedures. Notify our staff if you are taking any blood thinners. Depending on which one you take, there will be specific instructions on how and when to stop it. . Diabetics on insulin: Notify the staff so that you can be scheduled 1st case in the morning. If your diabetes requires high dose insulin, take only  of your normal insulin dose the morning of the procedure and notify the staff that you have done so. . Preventing infections: Shower with an antibacterial soap the morning of your procedure. . Build-up your immune system: Take 1000 mg of Vitamin C with every meal (3 times a day) the day prior to your procedure. . Antibiotics: Inform the staff if you have a condition or reason that requires you to take antibiotics before dental procedures. . Pregnancy: If you are pregnant, call and cancel the procedure. . Sickness: If you have a cold, fever, or any active infections, call and cancel the procedure. . Arrival: You must be in the facility at least 30 minutes prior to your scheduled procedure. . Children: Do not bring children with you. . Dress appropriately:  Bring dark clothing that you would not mind if they get stained. . Valuables: Do not bring any jewelry or valuables.  Reasons to call and reschedule or cancel your procedure: (Following these recommendations will minimize the risk of a serious complication.) . Surgeries: Avoid having procedures within 2 weeks of any surgery. (Avoid for 2 weeks before or after any surgery). . Flu Shots: Avoid having procedures within 2 weeks of a flu shots or . (Avoid for 2 weeks before or after immunizations). . Barium: Avoid having a procedure within 7-10 days after having had a radiological study involving the use of radiological contrast. (Myelograms, Barium swallow or enema study). . Heart attacks: Avoid any elective procedures or surgeries for the initial 6 months after a "Myocardial Infarction" (Heart Attack). . Blood thinners: It is imperative that you stop these medications before procedures. Let us know if you if you take any blood thinner.  . Infection: Avoid procedures during or within two weeks of an infection (including chest colds or gastrointestinal problems). Symptoms associated with infections include: Localized redness, fever, chills, night sweats or profuse sweating, burning sensation when voiding, cough, congestion, stuffiness, runny nose, sore throat, diarrhea, nausea, vomiting, cold or Flu symptoms, recent or current infections. It is specially important if the infection is over the area that we intend to treat. . Heart and lung problems: Symptoms that may suggest an active cardiopulmonary problem include: cough, chest pain, breathing difficulties or shortness of breath, dizziness, ankle swelling, uncontrolled high or unusually low blood pressure, and/or palpitations. If you are experiencing any of these symptoms, cancel your procedure and contact your primary care physician for an evaluation.  Remember:  Regular Business hours are:    Monday to Thursday 8:00 AM to 4:00 PM  Provider's  Schedule: Shalissa Easterwood, MD:  Procedure days: Tuesday and Thursday 7:30 AM to 4:00 PM  Bilal Lateef, MD:  Procedure days: Monday and Wednesday 7:30 AM to 4:00 PM ____________________________________________________________________________________________    

## 2019-12-24 ENCOUNTER — Ambulatory Visit: Payer: No Typology Code available for payment source | Admitting: Pain Medicine

## 2019-12-24 NOTE — Progress Notes (Deleted)
No show to appointment.

## 2022-05-24 IMAGING — CR DG SHOULDER 2+V*L*
3 series · 3 of 3 positions shown · non-contrast
Comparison: None.

CLINICAL DATA: Pain

EXAM:
LEFT SHOULDER - 2+ VIEW

[shoulder grashey]
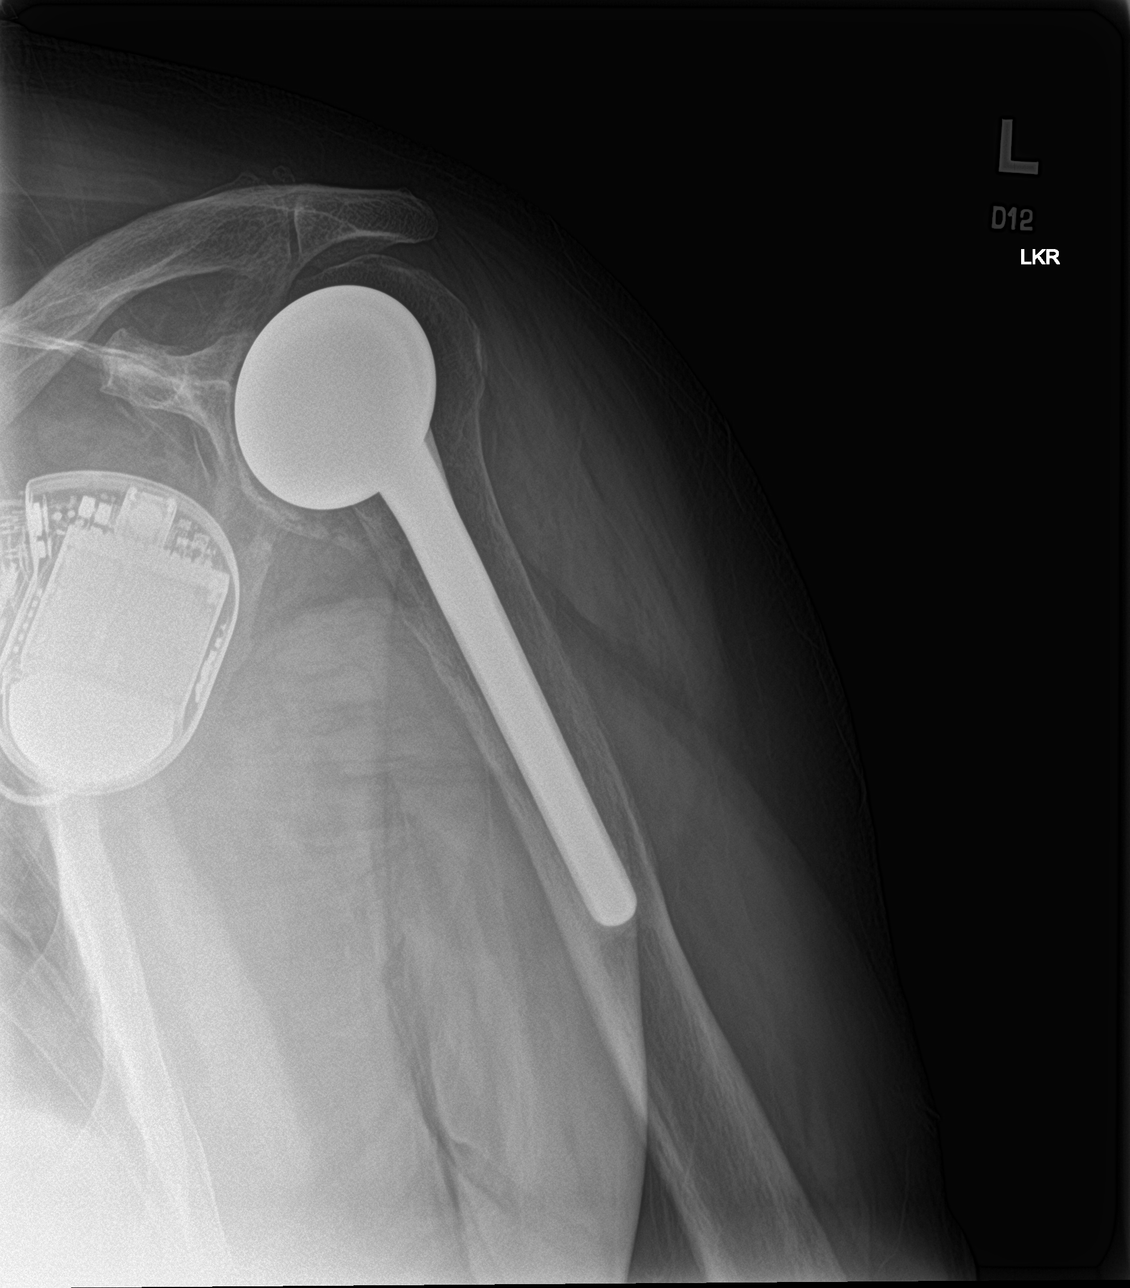

[shoulder y view]
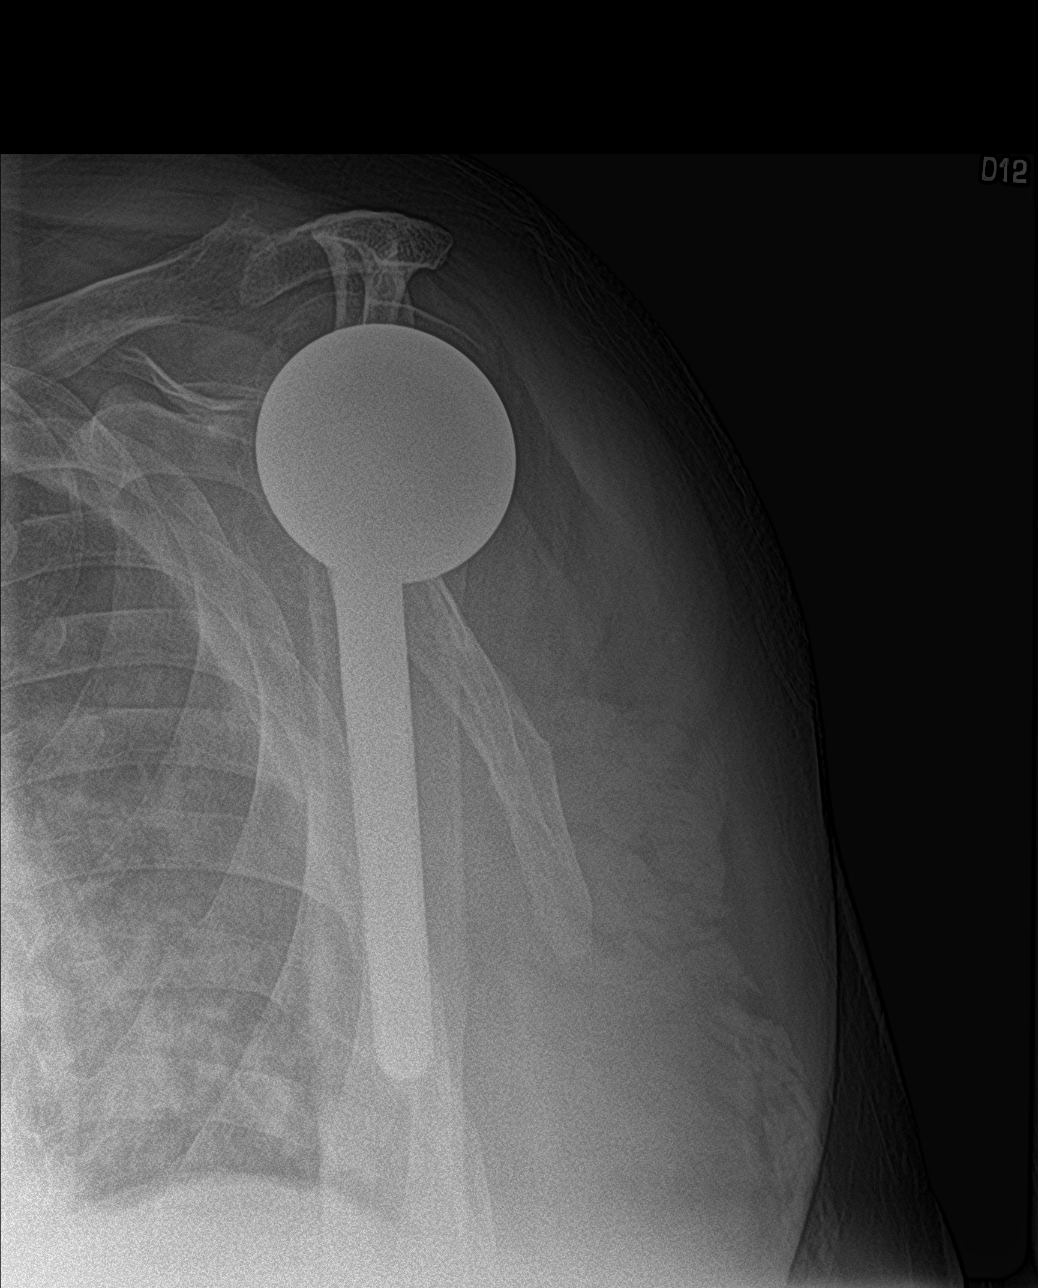

[shoulder axillary]
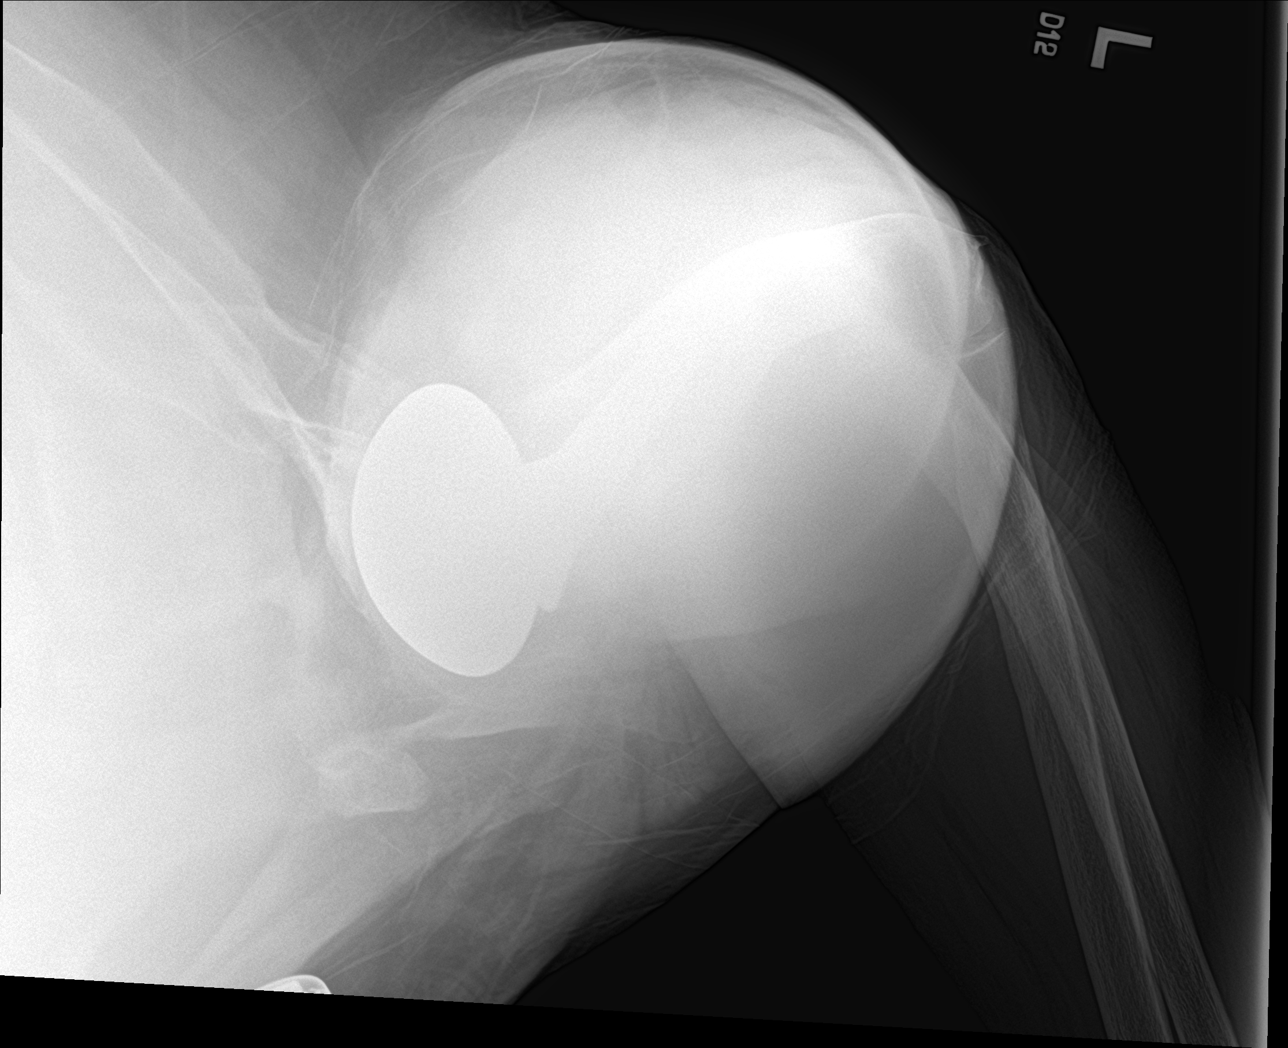

[3 of 3 positions shown; findings below may reference images not displayed]

FINDINGS: Oblique, Y scapular, and axillary images obtained. There is a total
shoulder replacement on the left with prosthetic components
well-seated. No fracture or dislocation. There is narrowing of the
glenohumeral joint. Remodeling in the glenoid is likely due to prior
arthropathy. No erosive change. Pacemaker present on the left.
IMPRESSION: Total shoulder replacement on the left with prosthetic components
well-seated. Underlying osteoarthritic change. No acute fracture or
dislocation.

## 2022-05-24 IMAGING — CR DG SHOULDER 2+V*R*
3 series · 3 of 3 positions shown · non-contrast
Comparison: None.

CLINICAL DATA: RIGHT shoulder pain.

EXAM:
RIGHT SHOULDER - 2+ VIEW

[shoulder grashey]
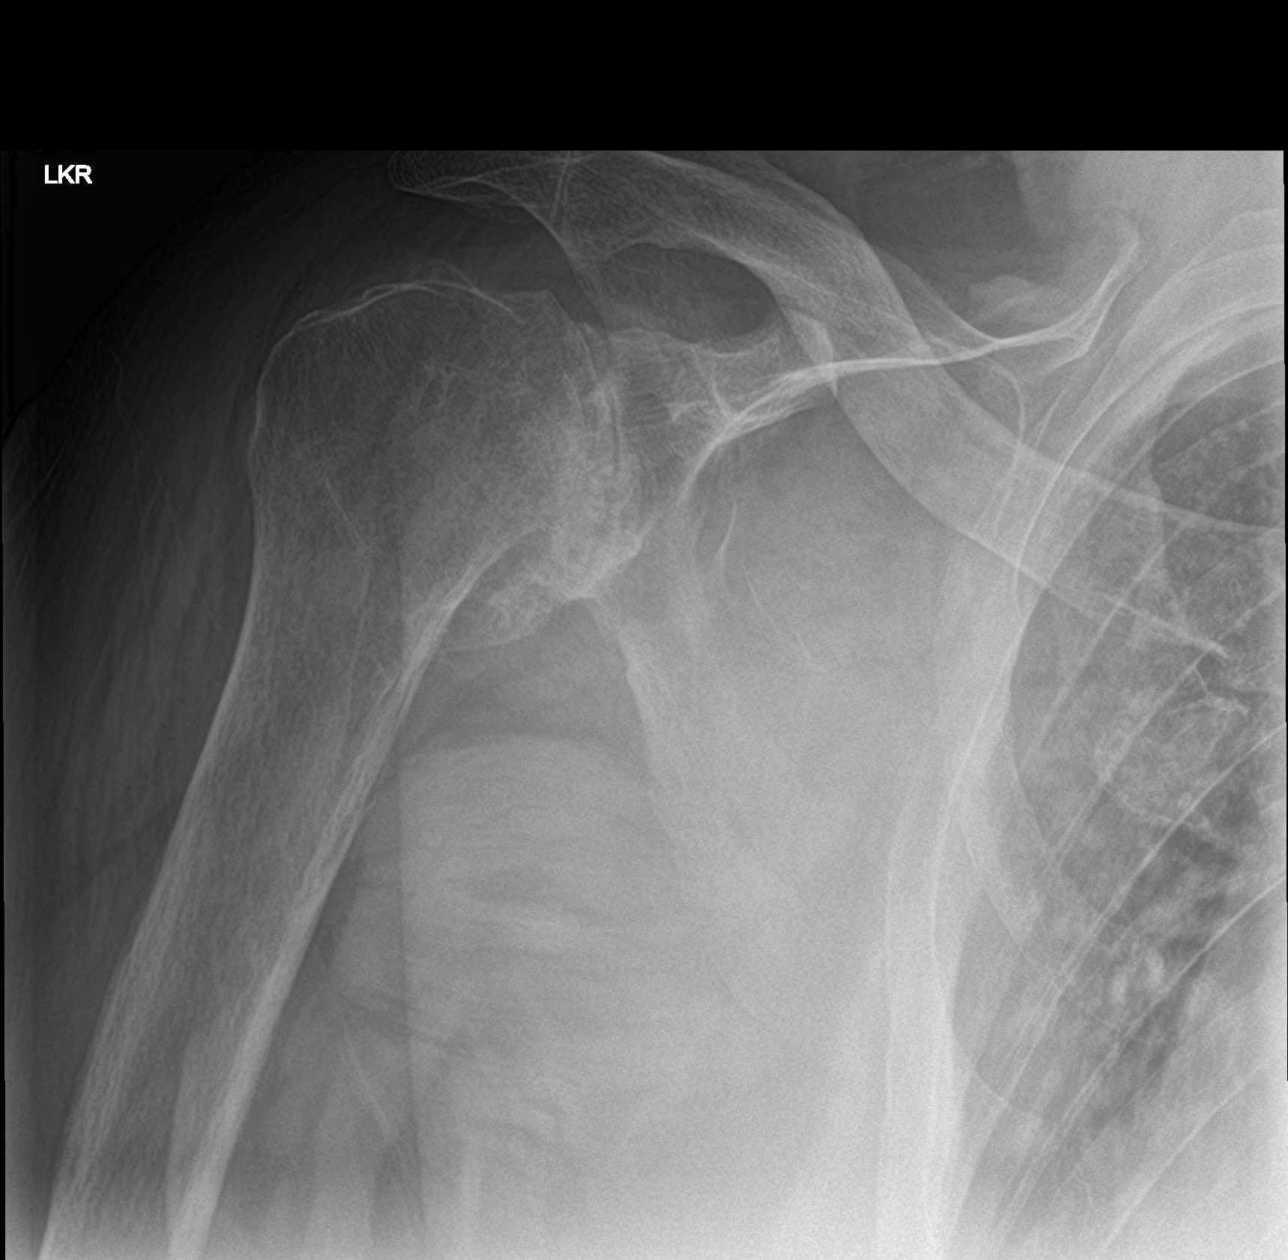

[shoulder y view]
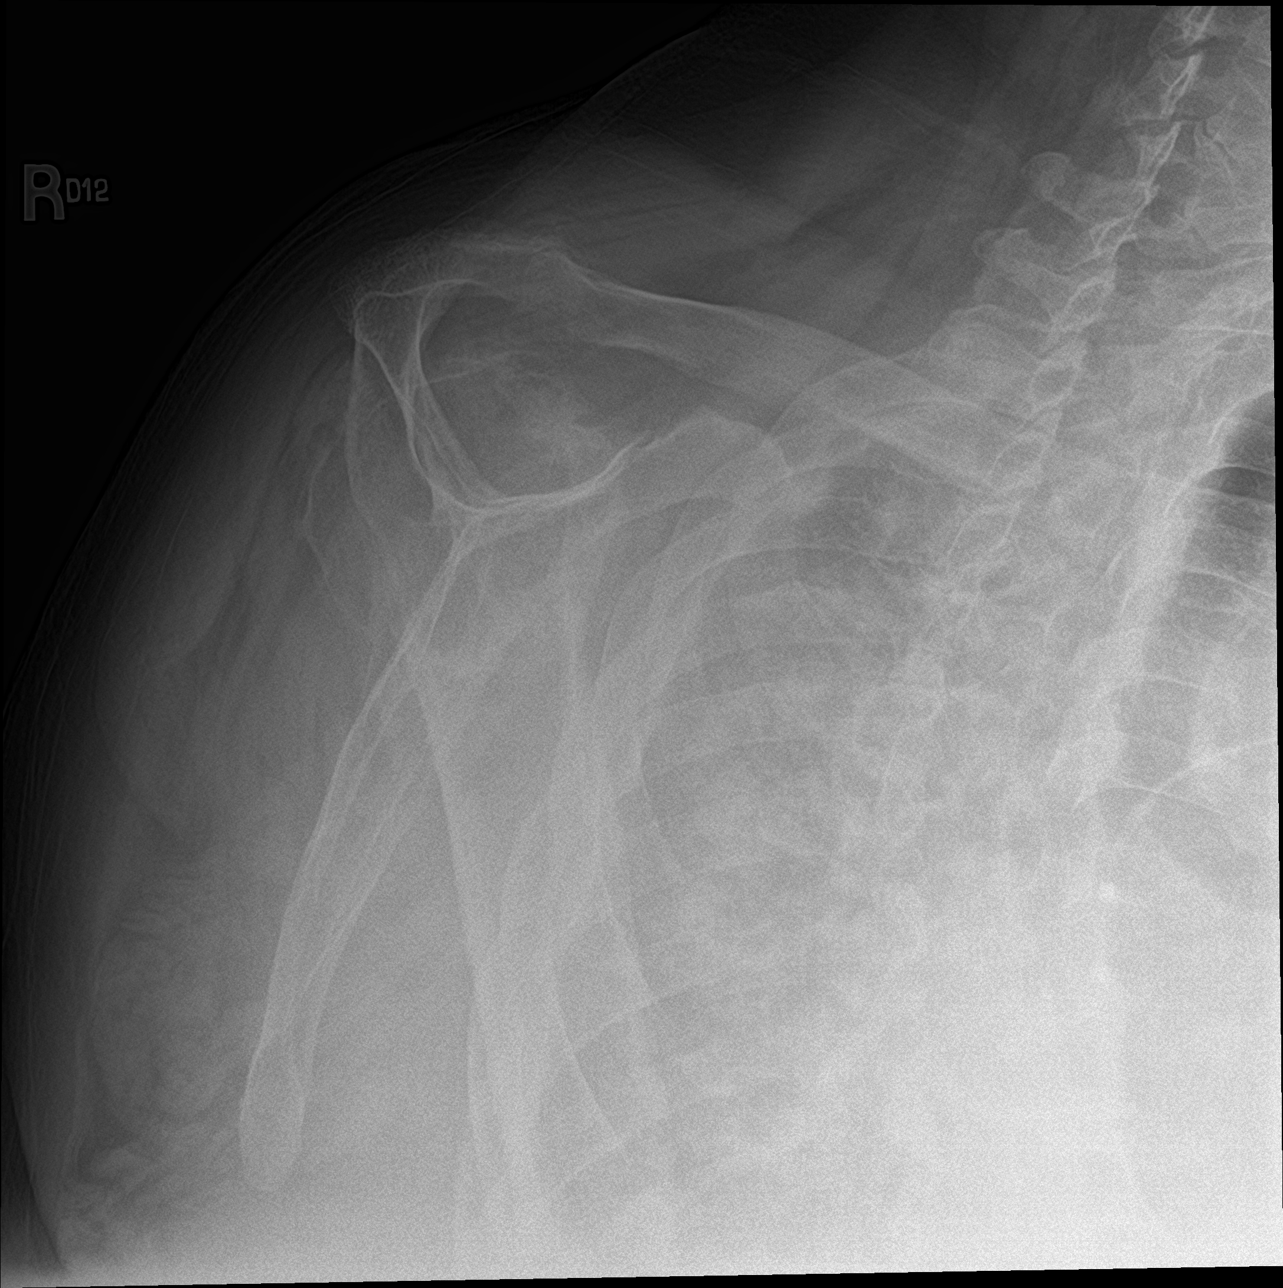

[shoulder axillary]
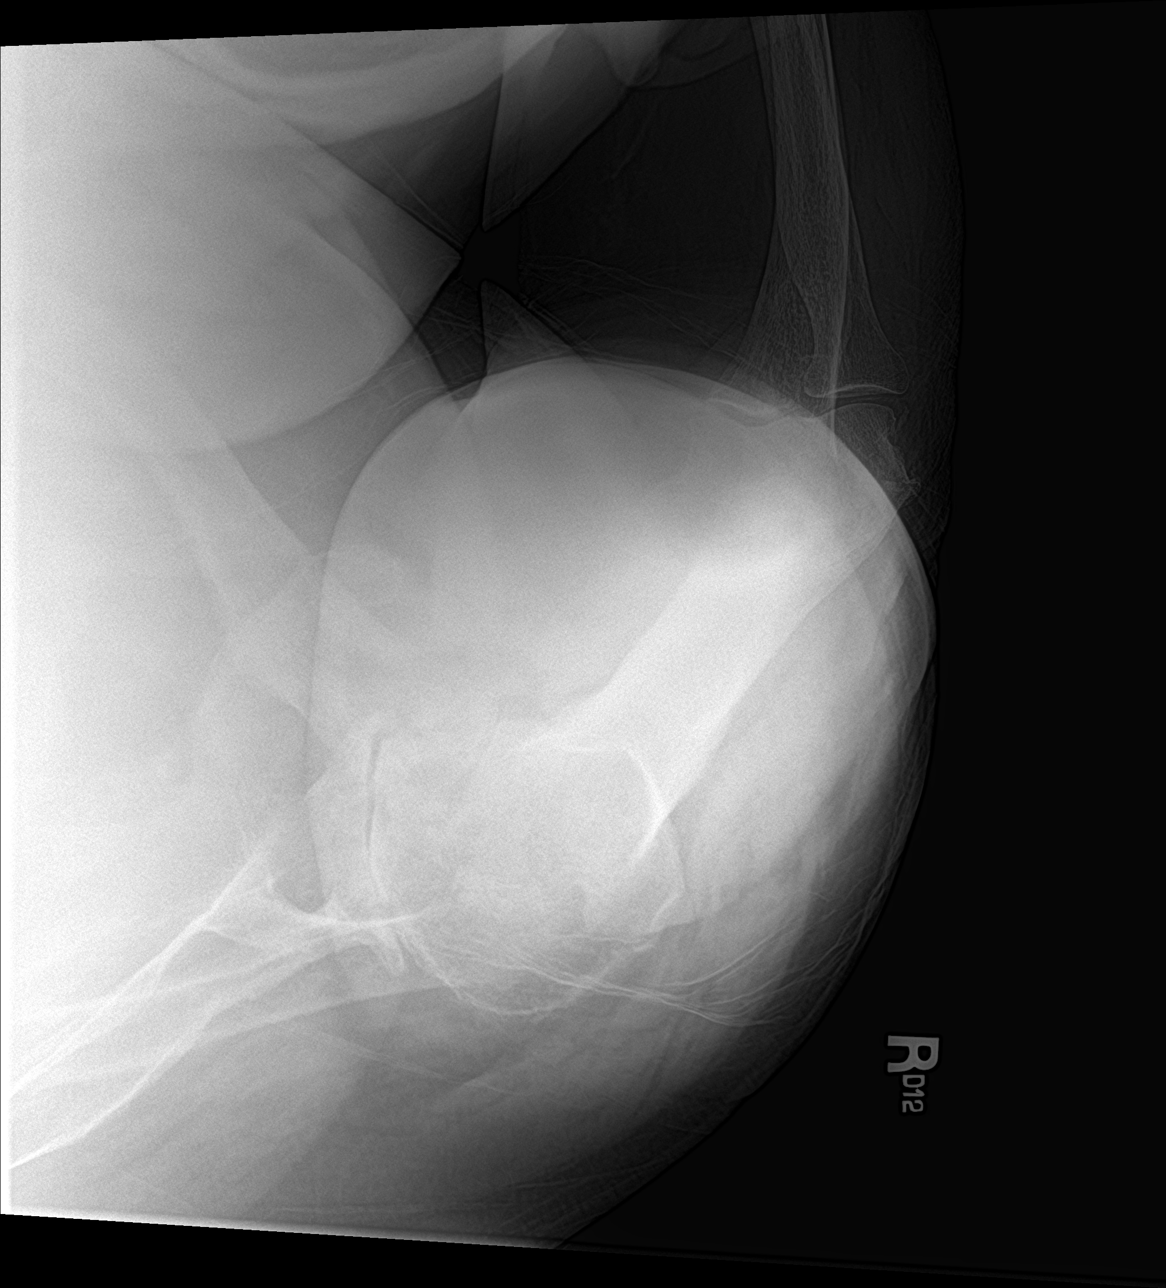

[3 of 3 positions shown; findings below may reference images not displayed]

FINDINGS: There is severe joint space narrowing of the glenohumeral joint.
There is sclerosis of the humeral head as well as osteophytosis. No
acute fracture dislocation identified.
IMPRESSION: 1. Severe arthropathy of the RIGHT shoulder joint.
2. No acute findings identified.

## 2024-04-06 DEATH — deceased
